# Patient Record
Sex: Female | Born: 1985 | Race: Black or African American | Hispanic: No | Marital: Single | Smoking: Never smoker
Health system: Southern US, Community
[De-identification: ages and names within clinical notes are randomized; demographics above are authoritative.]

## PROBLEM LIST (undated history)

## (undated) DIAGNOSIS — E11319 Type 2 diabetes mellitus with unspecified diabetic retinopathy without macular edema: Secondary | ICD-10-CM

## (undated) DIAGNOSIS — N289 Disorder of kidney and ureter, unspecified: Secondary | ICD-10-CM

## (undated) DIAGNOSIS — N183 Chronic kidney disease, stage 3 unspecified: Secondary | ICD-10-CM

## (undated) DIAGNOSIS — H547 Unspecified visual loss: Secondary | ICD-10-CM

## (undated) DIAGNOSIS — E119 Type 2 diabetes mellitus without complications: Secondary | ICD-10-CM

## (undated) DIAGNOSIS — I10 Essential (primary) hypertension: Secondary | ICD-10-CM

## (undated) HISTORY — PX: OTHER SURGICAL HISTORY: SHX169

## (undated) HISTORY — PX: EYE SURGERY: SHX253

## (undated) HISTORY — DX: Type 2 diabetes mellitus without complications: E11.9

## (undated) HISTORY — PX: APPENDECTOMY: SHX54

## (undated) HISTORY — PX: CHOLECYSTECTOMY: SHX55

## (undated) HISTORY — PX: ORIF FEMUR FRACTURE: SHX2119

---

## 2017-11-17 ENCOUNTER — Other Ambulatory Visit: Payer: Self-pay

## 2017-11-17 ENCOUNTER — Emergency Department (HOSPITAL_COMMUNITY)
Admission: EM | Admit: 2017-11-17 | Discharge: 2017-11-17 | Disposition: A | Discharge status: Home / Self Care | Payer: Self-pay | Attending: Emergency Medicine | Admitting: Emergency Medicine

## 2017-11-17 ENCOUNTER — Encounter (HOSPITAL_COMMUNITY): Payer: Self-pay | Admitting: Emergency Medicine

## 2017-11-17 DIAGNOSIS — R739 Hyperglycemia, unspecified: Secondary | ICD-10-CM

## 2017-11-17 DIAGNOSIS — E1065 Type 1 diabetes mellitus with hyperglycemia: Secondary | ICD-10-CM | POA: Insufficient documentation

## 2017-11-17 DIAGNOSIS — R112 Nausea with vomiting, unspecified: Secondary | ICD-10-CM | POA: Insufficient documentation

## 2017-11-17 HISTORY — DX: Type 2 diabetes mellitus without complications: E11.9

## 2017-11-17 LAB — CBC WITH DIFFERENTIAL/PLATELET
BASOS PCT: 0 %
Basophils Absolute: 0 10*3/uL (ref 0.0–0.1)
Eosinophils Absolute: 0 10*3/uL (ref 0.0–0.7)
Eosinophils Relative: 0 %
HEMATOCRIT: 41.3 % (ref 36.0–46.0)
HEMOGLOBIN: 15.2 g/dL — AB (ref 12.0–15.0)
Lymphocytes Relative: 25 %
Lymphs Abs: 1.7 10*3/uL (ref 0.7–4.0)
MCH: 30.5 pg (ref 26.0–34.0)
MCHC: 36.8 g/dL — AB (ref 30.0–36.0)
MCV: 82.8 fL (ref 78.0–100.0)
MONOS PCT: 2 %
Monocytes Absolute: 0.1 10*3/uL (ref 0.1–1.0)
NEUTROS ABS: 5 10*3/uL (ref 1.7–7.7)
NEUTROS PCT: 73 %
PLATELETS: 225 10*3/uL (ref 150–400)
RBC: 4.99 MIL/uL (ref 3.87–5.11)
RDW: 11.6 % (ref 11.5–15.5)
WBC: 6.9 10*3/uL (ref 4.0–10.5)

## 2017-11-17 LAB — URINALYSIS, ROUTINE W REFLEX MICROSCOPIC
BILIRUBIN URINE: NEGATIVE
Glucose, UA: >=500 mg/dL — AB
HGB URINE DIPSTICK: NEGATIVE
Ketones, ur: 20 mg/dL — AB
Leukocytes, UA: NEGATIVE
NITRITE: NEGATIVE
PH: 7 (ref 5.0–8.0)
Protein, ur: 100 mg/dL — AB
SPECIFIC GRAVITY, URINE: 1.012 (ref 1.005–1.030)

## 2017-11-17 LAB — COMPREHENSIVE METABOLIC PANEL
ALK PHOS: 65 U/L (ref 38–126)
ALT: 16 U/L (ref 0–44)
AST: 16 U/L (ref 15–41)
Albumin: 3.4 g/dL — ABNORMAL LOW (ref 3.5–5.0)
Anion gap: 9 (ref 5–15)
BUN: 16 mg/dL (ref 6–20)
CALCIUM: 9.3 mg/dL (ref 8.9–10.3)
CO2: 26 mmol/L (ref 22–32)
CREATININE: 0.69 mg/dL (ref 0.44–1.00)
Chloride: 101 mmol/L (ref 98–111)
Glucose, Bld: 303 mg/dL — ABNORMAL HIGH (ref 70–99)
Potassium: 3.8 mmol/L (ref 3.5–5.1)
Sodium: 136 mmol/L (ref 135–145)
Total Bilirubin: 0.6 mg/dL (ref 0.3–1.2)
Total Protein: 7.2 g/dL (ref 6.5–8.1)

## 2017-11-17 LAB — BLOOD GAS, VENOUS
ACID-BASE EXCESS: 0.3 mmol/L (ref 0.0–2.0)
Bicarbonate: 24.2 mmol/L (ref 20.0–28.0)
FIO2: 21
O2 SAT: 91.8 %
PATIENT TEMPERATURE: 98.6
pCO2, Ven: 38.7 mmHg — ABNORMAL LOW (ref 44.0–60.0)
pH, Ven: 7.412 (ref 7.250–7.430)
pO2, Ven: 62.8 mmHg — ABNORMAL HIGH (ref 32.0–45.0)

## 2017-11-17 LAB — CBG MONITORING, ED
GLUCOSE-CAPILLARY: 235 mg/dL — AB (ref 70–99)
GLUCOSE-CAPILLARY: 190 mg/dL — AB (ref 70–99)
Glucose-Capillary: 300 mg/dL — ABNORMAL HIGH (ref 70–99)
Glucose-Capillary: 299 mg/dL — ABNORMAL HIGH (ref 70–99)
Glucose-Capillary: 233 mg/dL — ABNORMAL HIGH (ref 70–99)

## 2017-11-17 LAB — I-STAT BETA HCG BLOOD, ED (MC, WL, AP ONLY): I-stat hCG, quantitative: <5 m[IU]/mL (ref <5)

## 2017-11-17 LAB — LIPASE, BLOOD: Lipase: 24 U/L (ref 11–51)

## 2017-11-17 MED ORDER — PROMETHAZINE HCL 25 MG PO TABS: 25.0000 mg | ORAL_TABLET | Freq: Four times a day (QID) | ORAL | 0 refills | Status: DC | PRN

## 2017-11-17 MED ORDER — ONDANSETRON HCL 4 MG/2ML IJ SOLN
4.0000 mg | Freq: Once | INTRAMUSCULAR | Status: AC
  Administered 2017-11-17: 4 mg via INTRAVENOUS
  Filled 2017-11-17: qty 2

## 2017-11-17 MED ORDER — SODIUM CHLORIDE 0.9 % IV BOLUS
2000.0000 mL | Freq: Once | INTRAVENOUS | Status: AC
  Administered 2017-11-17: 2000 mL via INTRAVENOUS

## 2017-11-17 MED ORDER — IBUPROFEN 200 MG PO TABS
600.0000 mg | ORAL_TABLET | Freq: Once | ORAL | Status: AC
  Administered 2017-11-17: 600 mg via ORAL
  Filled 2017-11-17: qty 3

## 2017-11-17 MED ORDER — DEXTROSE-NACL 5-0.9 % IV SOLN
INTRAVENOUS | Status: DC
  Administered 2017-11-17: 05:00:00 via INTRAVENOUS

## 2017-11-17 MED ORDER — SODIUM CHLORIDE 0.9 % IV SOLN
INTRAVENOUS | Status: DC
  Administered 2017-11-17: 2.4 [IU]/h via INTRAVENOUS
  Administered 2017-11-17: 3.5 [IU]/h via INTRAVENOUS
  Administered 2017-11-17: 5.3 [IU]/h via INTRAVENOUS
  Filled 2017-11-17: qty 1

## 2017-11-17 MED ORDER — PROMETHAZINE HCL 25 MG/ML IJ SOLN
12.5000 mg | Freq: Once | INTRAMUSCULAR | Status: AC
  Administered 2017-11-17: 12.5 mg via INTRAVENOUS
  Filled 2017-11-17: qty 1

## 2017-11-17 NOTE — ED Provider Notes (Signed)
[facility] DEPT Provider Note: [name], MD, FACEP  CSN: [id number] MRN: [id number] ARRIVAL: 11/17/17 at [facility]  Vomiting and Hyperglycemia   HISTORY OF PRESENT ILLNESS  11/17/17 1:56 AM Bianca Tapia is a 32 y.o. female is a type I diabetic who only uses insulin intermittently.  She restarted using insulin several days ago.  Specifically she has been taking 10 units of Lantus with her evening meals and 5 units of insulin aspart in the morning.  2 days ago her sugar went as high as 698.  Yesterday her sugar went as high as 547 but went down to 247 later in the day.    She is here with with nausea and vomiting since 4:30 PM yesterday.  She has had persistent vomiting and has not been able to keep anything down on her stomach.  Her sugar here is noted to be 300.  She is not short of breath and she is not having abdominal pain.     Past Medical History:  Diagnosis Date  . Diabetes mellitus without complication ([place])    Type 1    Past Surgical History:  Procedure Laterality Date  . APPENDECTOMY    . c-section    . CHOLECYSTECTOMY      History reviewed. No pertinent family history.  Social History   Tobacco Use  . Smoking status: Never Smoker  . Smokeless tobacco: Never Used  Substance Use Topics  . Alcohol use: Never    Frequency: Never  . Drug use: Never    Prior to Admission medications   Medication Sig Start Date End Date Taking? Authorizing Provider  insulin glargine (LANTUS) 100 UNIT/ML injection Inject 10 Units into the skin daily after supper.   Yes [provider]  insulin regular (NOVOLIN R,HUMULIN R) 100 units/mL injection Inject 5 Units into the skin 3 (three) times daily before meals. Sliding scale Pt uses kwikpen   Yes [provider]    Allergies Other and Penicillins   REVIEW OF SYSTEMS  Negative except as noted here or in the History of Present Illness.   PHYSICAL EXAMINATION  Initial  Vital Signs Blood pressure (!) 159/109, pulse 94, temperature 98.3 F (36.8 C), temperature source Oral, resp. rate 17, height 5\' 1"  (1.549 m), weight 81.6 kg (180 lb), last menstrual period 10/22/2017, SpO2 100 %.  Examination General: Well-developed, well-nourished female in no acute distress; appearance consistent with age of record HENT: normocephalic; atraumatic; breath nonketotic Eyes: pupils equal, round and reactive to light; extraocular muscles intact Neck: supple Heart: regular rate and rhythm Lungs: clear to auscultation bilaterally Abdomen: soft; nondistended; nontender; no masses or hepatosplenomegaly; bowel sounds present Extremities: No deformity; full range of motion; pulses normal Neurologic: Awake, alert and oriented; motor function intact in all extremities and symmetric; no facial droop Skin: Warm and dry Psychiatric: Normal mood and affect   RESULTS  Summary of this visit's results, reviewed by myself:   EKG Interpretation  Date/Time:    Ventricular Rate:    PR Interval:    QRS Duration:   QT Interval:    QTC Calculation:   R Axis:     Text Interpretation:        Laboratory Studies: Results for orders placed or performed during the hospital encounter of 11/17/17 (from the past 24 hour(s))  CBG monitoring, ED     Status: Abnormal   Collection Time: 11/17/17  2:07 AM  Result Value Ref Range   Glucose-Capillary 300 (  H) 70 - 99 mg/dL  Blood gas, venous     Status: Abnormal   Collection Time: 11/17/17  3:01 AM  Result Value Ref Range   FIO2 21.00    pH, Ven 7.412 7.250 - 7.430   pCO2, Ven 38.7 (L) 44.0 - 60.0 mmHg   pO2, Ven 62.8 (H) 32.0 - 45.0 mmHg   Bicarbonate 24.2 20.0 - 28.0 mmol/L   Acid-Base Excess 0.3 0.0 - 2.0 mmol/L   O2 Saturation 91.8 %   Patient temperature 98.6    Collection site VEIN    Drawn by COLLECTED BY NURSE    Sample type VENOUS   CBG monitoring, ED     Status: Abnormal   Collection Time: 11/17/17  3:02 AM  Result Value  Ref Range   Glucose-Capillary 299 (H) 70 - 99 mg/dL  Lipase, blood     Status: None   Collection Time: 11/17/17  3:04 AM  Result Value Ref Range   Lipase 24 11 - 51 U/L  Comprehensive metabolic panel     Status: Abnormal   Collection Time: 11/17/17  3:04 AM  Result Value Ref Range   Sodium 136 135 - 145 mmol/L   Potassium 3.8 3.5 - 5.1 mmol/L   Chloride 101 98 - 111 mmol/L   CO2 26 22 - 32 mmol/L   Glucose, Bld 303 (H) 70 - 99 mg/dL   BUN 16 6 - 20 mg/dL   Creatinine, Ser 0.69 0.44 - 1.00 mg/dL   Calcium 9.3 8.9 - 10.3 mg/dL   Total Protein 7.2 6.5 - 8.1 g/dL   Albumin 3.4 (L) 3.5 - 5.0 g/dL   AST 16 15 - 41 U/L   ALT 16 0 - 44 U/L   Alkaline Phosphatase 65 38 - 126 U/L   Total Bilirubin 0.6 0.3 - 1.2 mg/dL   GFR calc non Af Amer >60 >60 mL/min   GFR calc Af Amer >60 >60 mL/min   Anion gap 9 5 - 15  CBC with Differential/Platelet     Status: Abnormal   Collection Time: 11/17/17  3:04 AM  Result Value Ref Range   WBC 6.9 4.0 - 10.5 K/uL   RBC 4.99 3.87 - 5.11 MIL/uL   Hemoglobin 15.2 (H) 12.0 - 15.0 g/dL   HCT 41.3 36.0 - 46.0 %   MCV 82.8 78.0 - 100.0 fL   MCH 30.5 26.0 - 34.0 pg   MCHC 36.8 (H) 30.0 - 36.0 g/dL   RDW 11.6 11.5 - 15.5 %   Platelets 225 150 - 400 K/uL   Neutrophils Relative % 73 %   Neutro Abs 5.0 1.7 - 7.7 K/uL   Lymphocytes Relative 25 %   Lymphs Abs 1.7 0.7 - 4.0 K/uL   Monocytes Relative 2 %   Monocytes Absolute 0.1 0.1 - 1.0 K/uL   Eosinophils Relative 0 %   Eosinophils Absolute 0.0 0.0 - 0.7 K/uL   Basophils Relative 0 %   Basophils Absolute 0.0 0.0 - 0.1 K/uL  I-Stat beta hCG blood, ED     Status: None   Collection Time: 11/17/17  3:27 AM  Result Value Ref Range   I-stat hCG, quantitative <5.0 <5 mIU/mL   Comment 3          CBG monitoring, ED     Status: Abnormal   Collection Time: 11/17/17  4:13 AM  Result Value Ref Range   Glucose-Capillary 233 (H) 70 - 99 mg/dL   Comment 1 Notify RN  Comment 2 Document in Chart   Urinalysis,  Routine w reflex microscopic     Status: Abnormal   Collection Time: 11/17/17  4:40 AM  Result Value Ref Range   Color, Urine YELLOW YELLOW   APPearance CLEAR CLEAR   Specific Gravity, Urine 1.012 1.005 - 1.030   pH 7.0 5.0 - 8.0   Glucose, UA >=500 (A) NEGATIVE mg/dL   Hgb urine dipstick NEGATIVE NEGATIVE   Bilirubin Urine NEGATIVE NEGATIVE   Ketones, ur 20 (A) NEGATIVE mg/dL   Protein, ur 100 (A) NEGATIVE mg/dL   Nitrite NEGATIVE NEGATIVE   Leukocytes, UA NEGATIVE NEGATIVE   RBC / HPF 0-5 0 - 5 RBC/hpf   WBC, UA 0-5 0 - 5 WBC/hpf   Bacteria, UA MANY (A) NONE SEEN   Squamous Epithelial / LPF 0-5 0 - 5  CBG monitoring, ED     Status: Abnormal   Collection Time: 11/17/17  5:28 AM  Result Value Ref Range   Glucose-Capillary 235 (H) 70 - 99 mg/dL   Comment 1 Notify RN    Comment 2 Document in Chart   POC CBG, ED     Status: Abnormal   Collection Time: 11/17/17  6:46 AM  Result Value Ref Range   Glucose-Capillary 190 (H) 70 - 99 mg/dL   Imaging Studies: No results found.  ED COURSE and MDM  Nursing notes and initial vitals signs, including pulse oximetry, reviewed.  Vitals:   11/17/17 0430 11/17/17 0430 11/17/17 0515 11/17/17 0646  BP: (!) 161/122 (!) 161/122 (!) 144/91 (!) 130/92  Pulse: (!) 107 (!) 109 (!) 109 (!) 101  Resp:  19 18 17   Temp:      TempSrc:      SpO2: 99% 99% 100% 100%  Weight:      Height:       6:58 AM Patient sugar down to 190.  Should her nausea return but is now controlled with Phenergan.  She has been able to tolerate oral intake without vomiting.  She is requesting a prescription for Phenergan tablets at home.  She was advised to return if symptoms worsen as she is at risk for DKA although was not in DKA on this visit.  PROCEDURES    ED DIAGNOSES     ICD-10-CM   1. Nausea and vomiting in adult R11.2   2. Hyperglycemia R73.9        Lovell Nuttall, Jenny Reichmann, MD 11/17/17 [phone]

## 2017-11-17 NOTE — ED Triage Notes (Signed)
Pt reports having vomiting since 4pm and has Type 1 DM and last reading was over 500.

## 2017-11-22 ENCOUNTER — Other Ambulatory Visit: Payer: Self-pay

## 2017-11-22 ENCOUNTER — Encounter (HOSPITAL_COMMUNITY): Payer: Self-pay | Admitting: *Deleted

## 2017-11-22 ENCOUNTER — Emergency Department (HOSPITAL_COMMUNITY): Payer: Self-pay

## 2017-11-22 ENCOUNTER — Emergency Department (HOSPITAL_COMMUNITY)
Admission: EM | Admit: 2017-11-22 | Discharge: 2017-11-22 | Disposition: A | Discharge status: Home / Self Care | Payer: Self-pay | Attending: Emergency Medicine | Admitting: Emergency Medicine

## 2017-11-22 DIAGNOSIS — G43909 Migraine, unspecified, not intractable, without status migrainosus: Secondary | ICD-10-CM | POA: Insufficient documentation

## 2017-11-22 DIAGNOSIS — N390 Urinary tract infection, site not specified: Secondary | ICD-10-CM | POA: Insufficient documentation

## 2017-11-22 DIAGNOSIS — Z79899 Other long term (current) drug therapy: Secondary | ICD-10-CM | POA: Insufficient documentation

## 2017-11-22 DIAGNOSIS — I10 Essential (primary) hypertension: Secondary | ICD-10-CM | POA: Insufficient documentation

## 2017-11-22 DIAGNOSIS — E119 Type 2 diabetes mellitus without complications: Secondary | ICD-10-CM | POA: Insufficient documentation

## 2017-11-22 DIAGNOSIS — G43809 Other migraine, not intractable, without status migrainosus: Secondary | ICD-10-CM

## 2017-11-22 LAB — CBG MONITORING, ED
Glucose-Capillary: 182 mg/dL — ABNORMAL HIGH (ref 70–99)
Glucose-Capillary: 237 mg/dL — ABNORMAL HIGH (ref 70–99)

## 2017-11-22 LAB — CBC
HEMATOCRIT: 39.9 % (ref 36.0–46.0)
Hemoglobin: 14 g/dL (ref 12.0–15.0)
MCH: 30.1 pg (ref 26.0–34.0)
MCHC: 35.1 g/dL (ref 30.0–36.0)
MCV: 85.8 fL (ref 78.0–100.0)
Platelets: 232 10*3/uL (ref 150–400)
RBC: 4.65 MIL/uL (ref 3.87–5.11)
RDW: 11.8 % (ref 11.5–15.5)
WBC: 4.5 10*3/uL (ref 4.0–10.5)

## 2017-11-22 LAB — URINALYSIS, ROUTINE W REFLEX MICROSCOPIC
Bilirubin Urine: NEGATIVE
GLUCOSE, UA: 100 mg/dL — AB
Ketones, ur: NEGATIVE mg/dL
LEUKOCYTES UA: NEGATIVE
Nitrite: NEGATIVE
PH: 7.5 (ref 5.0–8.0)
Protein, ur: 100 mg/dL — AB
Specific Gravity, Urine: 1.025 (ref 1.005–1.030)

## 2017-11-22 LAB — URINALYSIS, MICROSCOPIC (REFLEX)

## 2017-11-22 LAB — BASIC METABOLIC PANEL
Anion gap: 7 (ref 5–15)
BUN: 12 mg/dL (ref 6–20)
CHLORIDE: 104 mmol/L (ref 98–111)
CO2: 29 mmol/L (ref 22–32)
Calcium: 9.2 mg/dL (ref 8.9–10.3)
Creatinine, Ser: 0.78 mg/dL (ref 0.44–1.00)
GFR calc non Af Amer: >60 mL/min (ref >60)
Glucose, Bld: 206 mg/dL — ABNORMAL HIGH (ref 70–99)
POTASSIUM: 4 mmol/L (ref 3.5–5.1)
SODIUM: 140 mmol/L (ref 135–145)

## 2017-11-22 LAB — I-STAT BETA HCG BLOOD, ED (MC, WL, AP ONLY)

## 2017-11-22 MED ORDER — KETOROLAC TROMETHAMINE 30 MG/ML IJ SOLN
30.0000 mg | Freq: Once | INTRAMUSCULAR | Status: AC
  Administered 2017-11-22: 30 mg via INTRAVENOUS
  Filled 2017-11-22: qty 1

## 2017-11-22 MED ORDER — HYDROCHLOROTHIAZIDE 25 MG PO TABS: 25.0000 mg | ORAL_TABLET | Freq: Every day | ORAL | 0 refills | Status: DC

## 2017-11-22 MED ORDER — NITROFURANTOIN MONOHYD MACRO 100 MG PO CAPS: 100.0000 mg | ORAL_CAPSULE | Freq: Two times a day (BID) | ORAL | 0 refills | Status: DC

## 2017-11-22 MED ORDER — PROCHLORPERAZINE MALEATE 10 MG PO TABS
10.0000 mg | ORAL_TABLET | Freq: Four times a day (QID) | ORAL | Status: DC | PRN
  Filled 2017-11-22: qty 1

## 2017-11-22 MED ORDER — HYDROCHLOROTHIAZIDE 12.5 MG PO CAPS
25.0000 mg | ORAL_CAPSULE | Freq: Once | ORAL | Status: AC
  Administered 2017-11-22: 25 mg via ORAL
  Filled 2017-11-22: qty 2

## 2017-11-22 MED ORDER — PROCHLORPERAZINE MALEATE 10 MG PO TABS
10.0000 mg | ORAL_TABLET | Freq: Once | ORAL | Status: AC
  Administered 2017-11-22: 10 mg via ORAL

## 2017-11-22 MED ORDER — DIPHENHYDRAMINE HCL 50 MG/ML IJ SOLN
25.0000 mg | Freq: Once | INTRAMUSCULAR | Status: AC
  Administered 2017-11-22: 25 mg via INTRAVENOUS
  Filled 2017-11-22: qty 1

## 2017-11-22 MED ORDER — ONDANSETRON 4 MG PO TBDP
4.0000 mg | ORAL_TABLET | Freq: Once | ORAL | Status: AC | PRN
  Administered 2017-11-22: 4 mg via ORAL
  Filled 2017-11-22: qty 1

## 2017-11-22 NOTE — Discharge Instructions (Addendum)
Return to ED for worsening symptoms, severe headache or chest pain, shortness of breath, coughing up blood. It is important that you follow-up with a primary care provider regarding your blood pressure and routine lab work.  Please present to [facility] urgent care/family medicine in 1 week for recheck of your electrolytes.  You should be hearing from [facility] as I placed a referral to them, however you do not hear from them within 7 to 10 days, please place a call to them.  If possible, please keep a headache log with checks of your blood pressure at [facility] or other outpatient facilities until you can establish primary care.

## 2017-11-22 NOTE — ED Notes (Signed)
Patient does not have glasses with her in the ED for visual acuity screening. PA made aware.

## 2017-11-22 NOTE — ED Provider Notes (Signed)
[facility] DEPT Provider Note   CSN: [id number] Arrival date & time: 11/22/17  1243     History   Chief Complaint Chief Complaint  Patient presents with  . Emesis  . Headache    HPI Bianca Tapia is a 32 y.o. female with a past medical history of type 1 diabetes, who presents to ED for evaluation of vomiting that began this morning.  She reports 2 episodes of nonbloody, nonbilious emesis.  She also complains of headache for the past 7 days, with associated nausea, phonophobia and photophobia it is unrelieved with Tylenol and ibuprofen.  Reports history of headaches but states that usually they are relieved with over-the-counter medications.  She is concerned because her blood pressure is high.  She also states she has had lower extremity edema the past week.  She was seen and evaluated in the ED 1 week ago but states that "they never treated my blood pressure." She is concerned because her "urine smells like acetone." Patient recently moved to [place] state and has not yet established with a primary care provider here.  She reports compliance with her home insulin regimen.  Denies any hyperglycemia, diarrhea, fever, cough, neck pain, head injuries or falls, numbness in arms or legs, chest pain or shortness of breath.  HPI  Past Medical History:  Diagnosis Date  . Diabetes mellitus without complication ([place])    Type 1    There are no active problems to display for this patient.   Past Surgical History:  Procedure Laterality Date  . APPENDECTOMY    . c-section    . CHOLECYSTECTOMY       OB History   None      Home Medications    Prior to Admission medications   Medication Sig Start Date End Date Taking? Authorizing Provider  hydrochlorothiazide (HYDRODIURIL) 25 MG tablet Take 1 tablet (25 mg total) by mouth daily. 11/22/17   Charlsey Moragne, PA-C  insulin glargine (LANTUS) 100 UNIT/ML injection Inject 10 Units into the skin daily  after supper.    [provider]  insulin regular (NOVOLIN R,HUMULIN R) 100 units/mL injection Inject 5 Units into the skin 3 (three) times daily before meals. Sliding scale Pt uses Forensic psychologist, Historical, MD  nitrofurantoin, macrocrystal-monohydrate, (MACROBID) 100 MG capsule Take 1 capsule (100 mg total) by mouth 2 (two) times daily. 11/22/17   Deloras Reichard, PA-C  promethazine (PHENERGAN) 25 MG tablet Take 1 tablet (25 mg total) by mouth every 6 (six) hours as needed for nausea or vomiting. 11/17/17   Molpus, Jenny Reichmann, MD    Family History No family history on file.  Social History Social History   Tobacco Use  . Smoking status: Never Smoker  . Smokeless tobacco: Never Used  Substance Use Topics  . Alcohol use: Never    Frequency: Never  . Drug use: Never     Allergies   Other and Penicillins   Review of Systems Review of Systems  Constitutional: Positive for appetite change. Negative for chills and fever.  HENT: Negative for ear pain, rhinorrhea, sneezing and sore throat.   Eyes: Positive for photophobia. Negative for visual disturbance.  Respiratory: Negative for cough, chest tightness, shortness of breath and wheezing.   Cardiovascular: Positive for leg swelling. Negative for chest pain and palpitations.  Gastrointestinal: Positive for constipation, nausea and vomiting. Negative for abdominal pain, blood in stool and diarrhea.  Genitourinary: Negative for dysuria, hematuria and urgency.  Musculoskeletal:  Negative for myalgias.  Skin: Negative for rash.  Neurological: Positive for headaches. Negative for dizziness, weakness and light-headedness.     Physical Exam Updated Vital Signs BP (!) 141/116 (BP Location: Right Arm)   Pulse 92   Temp 98 F (36.7 C) (Oral)   Resp 18   Ht 5\' 1"  (1.549 m)   Wt 81.6 kg (180 lb)   LMP 11/19/2017   SpO2 99%   BMI 34.01 kg/m   Physical Exam  Constitutional: She is oriented to person, place, and time. She appears  well-developed and well-nourished. No distress.  HENT:  Head: Normocephalic and atraumatic.  Nose: Nose normal.  Eyes: Conjunctivae and EOM are normal. Left eye exhibits no discharge. No scleral icterus.  Neck: Normal range of motion. Neck supple.  No meningismus.  Cardiovascular: Normal rate, regular rhythm, normal heart sounds and intact distal pulses. Exam reveals no gallop and no friction rub.  No murmur heard. Pulmonary/Chest: Effort normal and breath sounds normal. No respiratory distress.  Abdominal: Soft. Bowel sounds are normal. She exhibits no distension. There is no tenderness. There is no guarding.  Musculoskeletal: Normal range of motion. She exhibits edema.  1+ pitting edema in bilateral lower extremities symmetrically.  No calf tenderness bilaterally.  Neurological: She is alert and oriented to person, place, and time. No cranial nerve deficit or sensory deficit. She exhibits normal muscle tone. Coordination normal.  Pupils reactive. No facial asymmetry noted. Cranial nerves appear grossly intact. Sensation intact to light touch on face, BUE and BLE. Strength 5/5 in BUE and BLE.   Skin: Skin is warm and dry. No rash noted.  Psychiatric: She has a normal mood and affect.  Nursing note and vitals reviewed.    ED Treatments / Results  Labs (all labs ordered are listed, but only abnormal results are displayed) Labs Reviewed  BASIC METABOLIC PANEL - Abnormal; Notable for the following components:      Result Value   Glucose, Bld 206 (*)    All other components within normal limits  URINALYSIS, ROUTINE W REFLEX MICROSCOPIC - Abnormal; Notable for the following components:   APPearance CLOUDY (*)    Glucose, UA 100 (*)    Hgb urine dipstick SMALL (*)    Protein, ur 100 (*)    All other components within normal limits  URINALYSIS, MICROSCOPIC (REFLEX) - Abnormal; Notable for the following components:   Bacteria, UA MANY (*)    All other components within normal limits    CBG MONITORING, ED - Abnormal; Notable for the following components:   Glucose-Capillary 182 (*)    All other components within normal limits  CBC  I-STAT BETA HCG BLOOD, ED ([facility], [facility], [facility] ONLY)    EKG None  Radiology Dg Chest 2 View  Result Date: 11/22/2017 CLINICAL DATA:  Vomiting for several hours EXAM: CHEST - 2 VIEW COMPARISON:  None. FINDINGS: The heart size and mediastinal contours are within normal limits. Both lungs are clear. The visualized skeletal structures are unremarkable. IMPRESSION: No active cardiopulmonary disease. Electronically Signed   By: Inez Catalina M.D.   On: 11/22/2017 15:24   Ct Head Wo Contrast  Result Date: 11/22/2017 CLINICAL DATA:  Headaches EXAM: CT HEAD WITHOUT CONTRAST TECHNIQUE: Contiguous axial images were obtained from the base of the skull through the vertex without intravenous contrast. COMPARISON:  None. FINDINGS: Brain: No evidence of acute infarction, hemorrhage, hydrocephalus, extra-axial collection or mass lesion/mass effect. Vascular: No hyperdense vessel or unexpected calcification. Skull: Normal. Negative for fracture  or focal lesion. Sinuses/Orbits: No acute finding. Other: None. IMPRESSION: Normal head CT Electronically Signed   By: Inez Catalina M.D.   On: 11/22/2017 15:34    Procedures Procedures (including critical care time)  Medications Ordered in ED Medications  ketorolac (TORADOL) 30 MG/ML injection 30 mg (has no administration in time range)  hydrochlorothiazide (MICROZIDE) capsule 25 mg (has no administration in time range)  ondansetron (ZOFRAN-ODT) disintegrating tablet 4 mg (4 mg Oral Given 11/22/17 1309)  diphenhydrAMINE (BENADRYL) injection 25 mg (25 mg Intravenous Given 11/22/17 1528)  prochlorperazine (COMPAZINE) tablet 10 mg (10 mg Oral Given 11/22/17 1528)     Initial Impression / Assessment and Plan / ED Course  I have reviewed the triage vital signs and the nursing notes.  Pertinent labs & imaging results that were  available during my care of the patient were reviewed by me and considered in my medical decision making (see chart for details).     32 year old female with a past medical history of type 1 diabetes presents to ED for evaluation of vomiting that began this morning.  She reports 2 episodes of nonbloody, nonbilious emesis and headache for the past 7 days with associated nausea, phonophobia and photophobia.  Headache is not relieved with Tylenol and ibuprofen which she took the first 3 days of symptoms onset.  She also is concerned because her blood pressure is high and her urine "smells like acetone."  She states that she also has bilateral lower extremity edema.  She moved to [place] of [place] and has not yet established with a primary care provider here.  Reports compliance with her regimen.  On physical exam she is overall well-appearing.  No abdominal tenderness to palpation.  No deficits on neurological exam noted.  There is 1+ pitting edema in bilateral lower extremities with no erythema or calf tenderness.  Lab work including CBC, BMP, CBG, hCG unremarkable.  Urinalysis shows many bacteria but leukocyte and nitrite negative.  Chest x-ray is unremarkable. CT head wnl. Patient given migraine cocktail with improvement in her symptoms.  Will give 1 dose of HCTZ 25 mg for hypertension.  Will reassess after rest. Care handed off to A. Valere Dross, PA-C ending reassessment.  Macrobid for UTI.  Portions of this note were generated with Lobbyist. Dictation errors may occur despite best attempts at proofreading.   Final Clinical Impressions(s) / ED Diagnoses   Final diagnoses:  Other migraine without status migrainosus, not intractable  Hypertension, unspecified type  Lower urinary tract infectious disease    ED Discharge Orders        Ordered    hydrochlorothiazide (HYDRODIURIL) 25 MG tablet  Daily     11/22/17 1646    nitrofurantoin, macrocrystal-monohydrate, (MACROBID) 100  MG capsule  2 times daily     11/22/17 1646       Delia Heady, PA-C 11/22/17 1647    Malvin Johns, MD 11/22/17 1820

## 2017-11-22 NOTE — ED Notes (Signed)
Reviewed discharge instructions and prescriptions with pt and family prior to discharge. Pt verbalized understanding of such.

## 2017-11-22 NOTE — ED Provider Notes (Signed)
Physical Exam  BP (!) 141/116 (BP Location: Right Arm)   Pulse 92   Temp 98 F (36.7 C) (Oral)   Resp 18   Ht 5\' 1"  (1.549 m)   Wt 81.6 kg (180 lb)   LMP 11/19/2017   SpO2 99%   BMI 34.01 kg/m   Assumed care from [facility], PA-C at 4:38 PM. Briefly, the patient is a 32 y.o. female with PMHx of  has a past medical history of Diabetes mellitus without complication ([place]). here with vomiting, HA, and HTN.  Patient reports that after she got her headache that began 7 days ago was due to either elevated sugar or elevated blood pressure.  Patient reports that control of her blood sugar was not improving headache.  Patient trying multiple outpatient over-the-counter medication to prevent headache without relief.  Patient had resolution of headache with fluids, antiemetics, however reports that it has returned to my evaluation. Also reporting blurred vision bilaterally, appears to be intermittent in nature and associated with getting new glasses recently.  Labs Reviewed  BASIC METABOLIC PANEL - Abnormal; Notable for the following components:      Result Value   Glucose, Bld 206 (*)    All other components within normal limits  URINALYSIS, ROUTINE W REFLEX MICROSCOPIC - Abnormal; Notable for the following components:   APPearance CLOUDY (*)    Glucose, UA 100 (*)    Hgb urine dipstick SMALL (*)    Protein, ur 100 (*)    All other components within normal limits  URINALYSIS, MICROSCOPIC (REFLEX) - Abnormal; Notable for the following components:   Bacteria, UA MANY (*)    All other components within normal limits  CBG MONITORING, ED - Abnormal; Notable for the following components:   Glucose-Capillary 182 (*)    All other components within normal limits  CBC  I-STAT BETA HCG BLOOD, ED ([facility], [facility], [facility] ONLY)    Course of Care:   Physical Exam  Constitutional: She appears well-developed and well-nourished. No distress.  Sitting comfortably in bed.  HENT:  Head: Normocephalic and  atraumatic.  Eyes: Conjunctivae are normal. Right eye exhibits no discharge. Left eye exhibits no discharge.  EOMs normal to gross examination. Unable to fully assess fundi due to suboptimal pupil dilation. No retinal hemorrhage or exudate.  Neck: Normal range of motion.  Cardiovascular: Normal rate and regular rhythm.  Intact, 2+ radial pulse.  Pulmonary/Chest:  Normal respiratory effort. Patient converses comfortably. No audible wheeze or stridor.  Abdominal: She exhibits no distension.  Musculoskeletal: Normal range of motion.  Neurological: She is alert. GCS eye subscore is 4. GCS verbal subscore is 5. GCS motor subscore is 6.  Cranial nerves intact to gross observation. Pupils PERRL. EOMI. Patient moves extremities without difficulty.  Skin: Skin is warm and dry. She is not diaphoretic.  Psychiatric: She has a normal mood and affect. Her behavior is normal. Judgment and thought content normal.  Nursing note and vitals reviewed.   ED Course/Procedures   Clinical Course as of Nov 22 2052  Sat Nov 22, 2017  1816 Patient reassessed.  Patient reporting return of headache. Will administer Toradol per previous provider plan.   [AM]  2042 Reassessed.  Patient feeling better.  Discussed the plan including follow-up at [facility] urgent care in 1 week as well as amatory referral to neurology.  Patient is in understanding and agrees with the plan of care.   [AM]    Clinical Course User Index [AM] Albesa Seen, PA-C  Procedures  MDM  Patient nontoxic-appearing and in no acute distress.  Per plan discussed the previous provider, administered Toradol for headache.  Patient had improvement of headache.  No red flag symptoms per previous provider nor on my examination.  Did have this intermittent blurred vision, while she did have a recent change in her glasses that she appears to associate it with, will refer to neurology.  Fundi were not visible on my exam.  Patient has had persistently  elevated blood pressures on 2 ED visits now.  Patient is concerned about this.  Per discussion from previous provider, reasonable to start blood pressure medication.  I counseled patient on the side effects of HCTZ including increased diuresis.  Patient to follow-up in 1 week at [facility] urgent care for recheck of electrolytes.  Patient given outpatient primary care resources.  Patient given return precautions for any new or worsening symptoms.  Patient is in understanding and agrees with the plan of care.     Tamala Julian 11/22/17 2057    Duffy Bruce, MD 11/22/17 2351

## 2017-11-22 NOTE — ED Triage Notes (Signed)
Pt presents with vomiting that began this morning. Hx DM.  Pt reports her urine smells like "nail polish remover."  Pt feet swelling and a headache.  Pt reports not being able to keep her blood sugars in check.  Pt reports that this morning her CBG was 202.  Pt a/o x 4.

## 2018-03-25 ENCOUNTER — Emergency Department (HOSPITAL_COMMUNITY): Payer: Self-pay

## 2018-03-25 ENCOUNTER — Encounter (HOSPITAL_COMMUNITY): Payer: Self-pay | Admitting: Emergency Medicine

## 2018-03-25 ENCOUNTER — Emergency Department (HOSPITAL_COMMUNITY)
Admission: EM | Admit: 2018-03-25 | Discharge: 2018-03-25 | Disposition: A | Discharge status: Home / Self Care | Payer: Self-pay | Attending: Emergency Medicine | Admitting: Emergency Medicine

## 2018-03-25 DIAGNOSIS — Z794 Long term (current) use of insulin: Secondary | ICD-10-CM | POA: Insufficient documentation

## 2018-03-25 DIAGNOSIS — H6691 Otitis media, unspecified, right ear: Secondary | ICD-10-CM | POA: Insufficient documentation

## 2018-03-25 DIAGNOSIS — Z79899 Other long term (current) drug therapy: Secondary | ICD-10-CM | POA: Insufficient documentation

## 2018-03-25 DIAGNOSIS — J069 Acute upper respiratory infection, unspecified: Secondary | ICD-10-CM | POA: Insufficient documentation

## 2018-03-25 DIAGNOSIS — E109 Type 1 diabetes mellitus without complications: Secondary | ICD-10-CM | POA: Insufficient documentation

## 2018-03-25 MED ORDER — AZITHROMYCIN 250 MG PO TABS: ORAL_TABLET | ORAL | 0 refills | Status: DC

## 2018-03-25 MED ORDER — BENZONATATE 100 MG PO CAPS: 100.0000 mg | ORAL_CAPSULE | Freq: Three times a day (TID) | ORAL | 0 refills | Status: DC

## 2018-03-25 NOTE — ED Provider Notes (Signed)
[facility] DEPT Provider Note   CSN: [id number] Arrival date & time: 03/25/18  1511     History   Chief Complaint Chief Complaint  Patient presents with  . Cough  . Nasal Congestion  . Facial Pain    HPI Bianca Tapia is a 32 y.o. female who presents to the ED with productive cough, congestion, ear pain and sinus pain. Symptoms started a week ago. Patient reports coughing up yellow sputum.   The history is provided by the patient. No language interpreter was used.  Cough  This is a new problem. The current episode started more than 2 days ago. The cough is productive of purulent sputum. There has been no fever. Associated symptoms include chills, ear pain, headaches (slight), rhinorrhea, myalgias and shortness of breath. Pertinent negatives include no sore throat, no wheezing and no eye redness. She has tried decongestants for the symptoms. The treatment provided no relief. She is not a smoker. Her past medical history is significant for pneumonia.    Past Medical History:  Diagnosis Date  . Diabetes mellitus without complication ([place])    Type 1    There are no active problems to display for this patient.   Past Surgical History:  Procedure Laterality Date  . APPENDECTOMY    . c-section    . CHOLECYSTECTOMY       OB History   None      Home Medications    Prior to Admission medications   Medication Sig Start Date End Date Taking? Authorizing Provider  azithromycin (ZITHROMAX Z-PAK) 250 MG tablet Take 2 tablets now and then one tablet PO daily 03/25/18   Ashley Murrain, NP  benzonatate (TESSALON) 100 MG capsule Take 1 capsule (100 mg total) by mouth every 8 (eight) hours. 03/25/18   Ashley Murrain, NP  hydrochlorothiazide (HYDRODIURIL) 25 MG tablet Take 1 tablet (25 mg total) by mouth daily. 11/22/17   Khatri, Hina, PA-C  insulin glargine (LANTUS) 100 UNIT/ML injection Inject 10 Units into the skin daily after supper.    [provider]  insulin regular (NOVOLIN R,HUMULIN R) 100 units/mL injection Inject 5 Units into the skin 3 (three) times daily before meals. Sliding scale Pt uses Forensic psychologist, Historical, MD  nitrofurantoin, macrocrystal-monohydrate, (MACROBID) 100 MG capsule Take 1 capsule (100 mg total) by mouth 2 (two) times daily. 11/22/17   Khatri, Hina, PA-C  promethazine (PHENERGAN) 25 MG tablet Take 1 tablet (25 mg total) by mouth every 6 (six) hours as needed for nausea or vomiting. 11/17/17   Molpus, Jenny Reichmann, MD    Family History No family history on file.  Social History Social History   Tobacco Use  . Smoking status: Never Smoker  . Smokeless tobacco: Never Used  Substance Use Topics  . Alcohol use: Never    Frequency: Never  . Drug use: Never     Allergies   Other and Penicillins   Review of Systems Review of Systems  Constitutional: Positive for chills.  HENT: Positive for congestion, ear pain, rhinorrhea, sinus pressure and sinus pain. Negative for sore throat.   Eyes: Positive for itching. Negative for discharge and redness.  Respiratory: Positive for cough and shortness of breath. Negative for wheezing.   Gastrointestinal: Negative for abdominal pain, diarrhea, nausea and vomiting.  Genitourinary: Negative for dysuria, frequency and urgency.  Musculoskeletal: Positive for myalgias. Negative for back pain.  Skin: Negative for rash.  Neurological: Positive for headaches (slight). Negative for  syncope.  Psychiatric/Behavioral: Negative for confusion.     Physical Exam Updated Vital Signs BP (!) 137/94 (BP Location: Left Arm)   Pulse 99   Temp 98.6 F (37 C) (Oral)   Resp 18   Ht 5\' 2"  (1.575 m)   Wt 83.9 kg   SpO2 98%   BMI 33.84 kg/m   Physical Exam  Constitutional: She appears well-developed and well-nourished. No distress.  HENT:  Head: Normocephalic and atraumatic.  Right Ear: Tympanic membrane is bulging.  Left Ear: Tympanic membrane normal.  Nose:  Right sinus exhibits maxillary sinus tenderness.  Eyes: EOM are normal.  Neck: Neck supple.  Cardiovascular: Normal rate and regular rhythm.  Pulmonary/Chest: Effort normal. She has no wheezes. She has no rales. She exhibits no tenderness.  Abdominal: Soft. There is no tenderness.  Musculoskeletal: Normal range of motion.  Neurological: She is alert.  Skin: Skin is warm and dry.  Psychiatric: She has a normal mood and affect. Her behavior is normal.  Nursing note and vitals reviewed.    ED Treatments / Results  Labs (all labs ordered are listed, but only abnormal results are displayed) Labs Reviewed - No data to display Radiology Dg Chest 2 View  Result Date: 03/25/2018 CLINICAL DATA:  Sinus congestion, sinus pain, productive cough since last week. Diabetic, hypertension, chest pain. EXAM: CHEST - 2 VIEW COMPARISON:  Chest x-ray dated 11/22/2017. FINDINGS: Cardiomediastinal silhouette is within normal limits in size and configuration. Lungs are clear. Lung volumes are normal. No evidence of pneumonia. No pleural effusion. No pneumothorax seen. Osseous structures about the chest are unremarkable. IMPRESSION: No active cardiopulmonary disease. No evidence of pneumonia or pulmonary edema. Electronically Signed   By: Franki Cabot M.D.   On: 03/25/2018 17:42    Procedures Procedures (including critical care time)  Medications Ordered in ED Medications - No data to display   Initial Impression / Assessment and Plan / ED Course  I have reviewed the triage vital signs and the nursing notes. Pt CXR negative for acute infiltrate. Patients symptoms are consistent with URI and otitis media.  Discussed that antibiotics are for the ear infection. Pt will be discharged with Z-pak since she is allergic to penicillin. Patient verbalizes understanding and is agreeable with plan. Pt is hemodynamically stable & in NAD prior to dc.  Final Clinical Impressions(s) / ED Diagnoses   Final diagnoses:    Acute otitis media, right  URI, acute    ED Discharge Orders         Ordered    azithromycin (ZITHROMAX Z-PAK) 250 MG tablet     03/25/18 1752    benzonatate (TESSALON) 100 MG capsule  Every 8 hours     03/25/18 1752           Debroah Baller [place], NP 03/25/18 1757    Charlesetta Shanks, MD 03/26/18 2158

## 2018-03-25 NOTE — Discharge Instructions (Addendum)
Follow up with your doctor in the next few days to be sure your ear infection is improving.

## 2018-03-25 NOTE — ED Triage Notes (Signed)
Pt c/o sinus congestion/sinus pain, productive cough with yellow sputum since last week

## 2018-03-31 ENCOUNTER — Other Ambulatory Visit: Payer: Self-pay

## 2018-03-31 ENCOUNTER — Encounter (HOSPITAL_COMMUNITY): Payer: Self-pay | Admitting: Emergency Medicine

## 2018-03-31 DIAGNOSIS — B349 Viral infection, unspecified: Secondary | ICD-10-CM | POA: Insufficient documentation

## 2018-03-31 DIAGNOSIS — H1031 Unspecified acute conjunctivitis, right eye: Secondary | ICD-10-CM | POA: Insufficient documentation

## 2018-03-31 DIAGNOSIS — Z79899 Other long term (current) drug therapy: Secondary | ICD-10-CM | POA: Insufficient documentation

## 2018-03-31 DIAGNOSIS — E109 Type 1 diabetes mellitus without complications: Secondary | ICD-10-CM | POA: Insufficient documentation

## 2018-03-31 DIAGNOSIS — I10 Essential (primary) hypertension: Secondary | ICD-10-CM | POA: Insufficient documentation

## 2018-03-31 NOTE — ED Triage Notes (Signed)
Pt reports having cough, fever, congestion for the last week that has not been relieved by medications. Last motrin taken at 2000.

## 2018-04-01 ENCOUNTER — Emergency Department (HOSPITAL_COMMUNITY)
Admission: EM | Admit: 2018-04-01 | Discharge: 2018-04-01 | Disposition: A | Discharge status: Home / Self Care | Payer: Self-pay | Attending: Emergency Medicine | Admitting: Emergency Medicine

## 2018-04-01 ENCOUNTER — Emergency Department (HOSPITAL_COMMUNITY): Payer: Self-pay

## 2018-04-01 DIAGNOSIS — I10 Essential (primary) hypertension: Secondary | ICD-10-CM

## 2018-04-01 DIAGNOSIS — B349 Viral infection, unspecified: Secondary | ICD-10-CM

## 2018-04-01 DIAGNOSIS — H1031 Unspecified acute conjunctivitis, right eye: Secondary | ICD-10-CM

## 2018-04-01 MED ORDER — ERYTHROMYCIN 5 MG/GM OP OINT: 1.0000 "application " | TOPICAL_OINTMENT | Freq: Four times a day (QID) | OPHTHALMIC | 1 refills | Status: DC

## 2018-04-01 MED ORDER — IBUPROFEN 800 MG PO TABS
800.0000 mg | ORAL_TABLET | Freq: Once | ORAL | Status: AC
  Administered 2018-04-01: 800 mg via ORAL
  Filled 2018-04-01: qty 1

## 2018-04-01 MED ORDER — BENZONATATE 100 MG PO CAPS: 100.0000 mg | ORAL_CAPSULE | Freq: Three times a day (TID) | ORAL | 0 refills | Status: DC | PRN

## 2018-04-01 NOTE — ED Provider Notes (Signed)
[facility] DEPT Provider Note   CSN: [id number] Arrival date & time: 03/31/18  2218     History   Chief Complaint Chief Complaint  Patient presents with  . Cough  . Fever    HPI Bianca Tapia is a 32 y.o. female.  32 y/o female with hx of IDDM presents to the ED for evaluation of persistent URI symptoms.  Was evaluated 1 week ago for cough, congestion, fever.  States that her symptoms have persisted despite use of azithromycin x5 days.  Also complaining of body aches, ear pressure.  Developed pain to the right side of her chest under her breast 2 days ago.  This is worse with movement and with prolonged coughing.  Not specifically aggravated with deep breathing.  Notes temperature of 102 F prior to arrival.  Took over-the-counter Motrin at [facility].  Has also been using over-the-counter TheraFlu, cough medicine.  Has been able to tolerate food and fluids without difficulty.  She has not experienced any nausea or vomiting, ear drainage, abdominal pain.  The history is provided by the patient. No language interpreter was used.  Cough   Fever   Associated symptoms include cough.    Past Medical History:  Diagnosis Date  . Diabetes mellitus without complication ([place])    Type 1    There are no active problems to display for this patient.   Past Surgical History:  Procedure Laterality Date  . APPENDECTOMY    . c-section    . CHOLECYSTECTOMY       OB History   None      Home Medications    Prior to Admission medications   Medication Sig Start Date End Date Taking? Authorizing Provider  azithromycin (ZITHROMAX Z-PAK) 250 MG tablet Take 2 tablets now and then one tablet PO daily 03/25/18   Ashley Murrain, NP  benzonatate (TESSALON) 100 MG capsule Take 1 capsule (100 mg total) by mouth 3 (three) times daily as needed for cough. 04/01/18   Antonietta Breach, PA-C  erythromycin ophthalmic ointment Place 1 application into the right eye every 6  (six) hours. Place 1/2 inch ribbon of ointment in the affected eye 4 times a day 04/01/18   Antonietta Breach, PA-C  hydrochlorothiazide (HYDRODIURIL) 25 MG tablet Take 1 tablet (25 mg total) by mouth daily. 11/22/17   Khatri, Hina, PA-C  insulin glargine (LANTUS) 100 UNIT/ML injection Inject 10 Units into the skin daily after supper.    [provider]  insulin regular (NOVOLIN R,HUMULIN R) 100 units/mL injection Inject 5 Units into the skin 3 (three) times daily before meals. Sliding scale Pt uses Forensic psychologist, Historical, MD  nitrofurantoin, macrocrystal-monohydrate, (MACROBID) 100 MG capsule Take 1 capsule (100 mg total) by mouth 2 (two) times daily. 11/22/17   Khatri, Hina, PA-C  promethazine (PHENERGAN) 25 MG tablet Take 1 tablet (25 mg total) by mouth every 6 (six) hours as needed for nausea or vomiting. 11/17/17   Molpus, Jenny Reichmann, MD    Family History History reviewed. No pertinent family history.  Social History Social History   Tobacco Use  . Smoking status: Never Smoker  . Smokeless tobacco: Never Used  Substance Use Topics  . Alcohol use: Never    Frequency: Never  . Drug use: Never     Allergies   Other and Penicillins   Review of Systems Review of Systems  Constitutional: Positive for fever.  Respiratory: Positive for cough.    Ten systems reviewed  and are negative for acute change, except as noted in the HPI.    Physical Exam Updated Vital Signs BP (!) 139/96 (BP Location: Right Arm)   Pulse 87   Temp 100 F (37.8 C) (Oral)   Resp 20   Ht 5\' 2"  (1.575 m)   Wt 83.9 kg   LMP 01/29/2018   SpO2 98%   BMI 33.84 kg/m   Physical Exam  Constitutional: She is oriented to person, place, and time. She appears well-developed and well-nourished. No distress.  Nontoxic appearing and in NAD  HENT:  Head: Normocephalic and atraumatic.  Right Ear: Tympanic membrane, external ear and ear canal normal.  Left Ear: Tympanic membrane, external ear and ear canal  normal.  Audible nasal congestion. Clear posterior oropharynx. Tolerating secretions without difficulty.  Eyes: EOM are normal. No scleral icterus.  Conjunctival injection of the right eye without discharge. No pain with EOMs.  Neck: Normal range of motion.  No meningismus  Cardiovascular: Regular rhythm and intact distal pulses.  Mild tachycardia  Pulmonary/Chest: Effort normal. No stridor. No respiratory distress. She has no wheezes. She has no rales.  Lungs CTAB  Abdominal: Soft. She exhibits no distension and no mass. There is no tenderness. There is no guarding.  Soft, obese, nontender.  Musculoskeletal: Normal range of motion.  Neurological: She is alert and oriented to person, place, and time. She exhibits normal muscle tone. Coordination normal.  Skin: Skin is warm and dry. No rash noted. She is not diaphoretic. No erythema. No pallor.  Psychiatric: She has a normal mood and affect. Her behavior is normal.  Nursing note and vitals reviewed.    ED Treatments / Results  Labs (all labs ordered are listed, but only abnormal results are displayed) Labs Reviewed - No data to display  EKG None  Radiology Dg Chest 2 View  Result Date: 04/01/2018 CLINICAL DATA:  33 year old female with fever and congestion. EXAM: CHEST - 2 VIEW COMPARISON:  Chest radiograph dated 03/25/2018 FINDINGS: The heart size and mediastinal contours are within normal limits. Both lungs are clear. The visualized skeletal structures are unremarkable. IMPRESSION: No active cardiopulmonary disease. Electronically Signed   By: Anner Crete M.D.   On: 04/01/2018 01:24    Procedures Procedures (including critical care time)  Medications Ordered in ED Medications  ibuprofen (ADVIL,MOTRIN) tablet 800 mg (800 mg Oral Given 04/01/18 0033)     Initial Impression / Assessment and Plan / ED Course  I have reviewed the triage vital signs and the nursing notes.  Pertinent labs & imaging results that were  available during my care of the patient were reviewed by me and considered in my medical decision making (see chart for details).     32 year old female presents to the emergency department for complaints of persistent upper respiratory symptoms including congestion, cough with body aches.  Noted to be afebrile in the ED.  Reporting temperatures of 102F at home.  Has had symptoms for 1 week.  Was seen 7 days ago and placed on a course of azithromycin which she has completed.  Continues to feel symptomatic.  She is nontoxic in appearance on exam.  Lung sounds clear.  No signs of respiratory distress.    Repeat CXR negative for acute infiltrate.  She does have evidence of nonspecific conjunctivitis.  Will cover with erythromycin ointment.  Symptoms consistent with viral URI, especially given lack of improvement with previous antibiotic course.  Discussed that antibiotics are not indicated for viral infections. Pt will  be discharged with symptomatic treatment.  Return precautions discussed and provided. Patient discharged in stable condition with no unaddressed concerns.   Final Clinical Impressions(s) / ED Diagnoses   Final diagnoses:  Viral illness  Acute conjunctivitis of right eye, unspecified acute conjunctivitis type  Hypertension not at goal    ED Discharge Orders         Ordered    erythromycin ophthalmic ointment  Every 6 hours     04/01/18 0157    benzonatate (TESSALON) 100 MG capsule  3 times daily PRN     04/01/18 0157           Antonietta Breach, PA-C 04/01/18 0509    Ward, Delice Bison, DO 04/01/18 [phone]

## 2018-04-01 NOTE — Discharge Instructions (Signed)
Your chest xray does not show evidence of pneumonia. Your symptoms are likely due to a viral illness. We advise 600mg  ibuprofen every 6 hours as prescribed. You may supplement this with 1000mg  Tylenol every 8 hours, if desired. Be sure you drink fluids to prevent dehydration. Follow-up with a primary care doctor to ensure resolution of symptoms.

## 2018-05-27 ENCOUNTER — Encounter (HOSPITAL_COMMUNITY): Payer: Self-pay | Admitting: Obstetrics and Gynecology

## 2018-05-27 ENCOUNTER — Emergency Department (HOSPITAL_COMMUNITY)
Admission: EM | Admit: 2018-05-27 | Discharge: 2018-05-28 | Disposition: A | Discharge status: Home / Self Care | Payer: Managed Care, Other (non HMO) | Attending: Emergency Medicine | Admitting: Emergency Medicine

## 2018-05-27 ENCOUNTER — Other Ambulatory Visit: Payer: Self-pay

## 2018-05-27 DIAGNOSIS — E109 Type 1 diabetes mellitus without complications: Secondary | ICD-10-CM | POA: Diagnosis not present

## 2018-05-27 DIAGNOSIS — Z79899 Other long term (current) drug therapy: Secondary | ICD-10-CM | POA: Insufficient documentation

## 2018-05-27 DIAGNOSIS — H9201 Otalgia, right ear: Secondary | ICD-10-CM | POA: Diagnosis present

## 2018-05-27 DIAGNOSIS — H70001 Acute mastoiditis without complications, right ear: Secondary | ICD-10-CM | POA: Diagnosis not present

## 2018-05-27 NOTE — ED Triage Notes (Signed)
Pt reports pain in her right ear going up ito the right eye. RN did not see an obvious buildup of wax in the ear. Pt reports she recently did and cleanouts of the ear with peroxide. Pt reports she also feels like her balance is off.

## 2018-05-28 ENCOUNTER — Emergency Department (HOSPITAL_COMMUNITY): Payer: Managed Care, Other (non HMO)

## 2018-05-28 MED ORDER — LEVOFLOXACIN 500 MG PO TABS: 500.0000 mg | ORAL_TABLET | Freq: Every day | ORAL | 0 refills | Status: DC

## 2018-05-28 MED ORDER — FLUCONAZOLE 150 MG PO TABS: ORAL_TABLET | ORAL | 0 refills | Status: DC

## 2018-05-28 MED ORDER — LEVOFLOXACIN 500 MG PO TABS
500.0000 mg | ORAL_TABLET | Freq: Once | ORAL | Status: AC
  Administered 2018-05-28: 500 mg via ORAL
  Filled 2018-05-28: qty 1

## 2018-05-28 NOTE — ED Provider Notes (Signed)
[facility] DEPT Provider Note: [name], MD, FACEP  CSN: [id number] MRN: [id number] ARRIVAL: 05/27/18 at 2215 ROOM: WA06/WA06   CHIEF COMPLAINT  Ear Pain   HISTORY OF PRESENT ILLNESS  05/28/18 12:32 AM Bianca Tapia is a 33 y.o. female with type 1 diabetes.  She is here with a one-week history of pain in her right ear.  It started as a fullness and has worsened.  She was seen 4 days ago at [facility] and started on [place].  Despite this the pain persists and is now radiating to the right side of her face.  She rates her pain as a 7 out of 10.  She has tried cleaning her ear out with peroxide without success.  She feels like her balance is off at times.   Past Medical History:  Diagnosis Date  . Diabetes mellitus without complication ([place])    Type 1    Past Surgical History:  Procedure Laterality Date  . APPENDECTOMY    . c-section    . CHOLECYSTECTOMY      No family history on file.  Social History   Tobacco Use  . Smoking status: Never Smoker  . Smokeless tobacco: Never Used  Substance Use Topics  . Alcohol use: Never    Frequency: Never  . Drug use: Never    Prior to Admission medications   Medication Sig Start Date End Date Taking? Authorizing Provider  azithromycin (ZITHROMAX Z-PAK) 250 MG tablet Take 2 tablets now and then one tablet PO daily 03/25/18   Ashley Murrain, NP  benzonatate (TESSALON) 100 MG capsule Take 1 capsule (100 mg total) by mouth 3 (three) times daily as needed for cough. 04/01/18   Antonietta Breach, PA-C  erythromycin ophthalmic ointment Place 1 application into the right eye every 6 (six) hours. Place 1/2 inch ribbon of ointment in the affected eye 4 times a day 04/01/18   Antonietta Breach, PA-C  hydrochlorothiazide (HYDRODIURIL) 25 MG tablet Take 1 tablet (25 mg total) by mouth daily. 11/22/17   Khatri, Hina, PA-C  insulin glargine (LANTUS) 100 UNIT/ML injection Inject 10 Units into the skin daily after supper.    [provider]  insulin regular (NOVOLIN R,HUMULIN R) 100 units/mL injection Inject 5 Units into the skin 3 (three) times daily before meals. Sliding scale Pt uses Forensic psychologist, Historical, MD  nitrofurantoin, macrocrystal-monohydrate, (MACROBID) 100 MG capsule Take 1 capsule (100 mg total) by mouth 2 (two) times daily. 11/22/17   Khatri, Hina, PA-C  promethazine (PHENERGAN) 25 MG tablet Take 1 tablet (25 mg total) by mouth every 6 (six) hours as needed for nausea or vomiting. 11/17/17   [place] Ice, MD    Allergies Other and Penicillins   REVIEW OF SYSTEMS  Negative except as noted here or in the History of Present Illness.   PHYSICAL EXAMINATION  Initial Vital Signs Blood pressure (!) 142/92, pulse (!) 113, temperature 98.7 F (37.1 C), temperature source Oral, resp. rate 16, last menstrual period 05/06/2018, SpO2 98 %.  Examination General: Well-developed, well-nourished female in no acute distress; appearance consistent with age of record HENT: normocephalic; atraumatic; left TM normal; right TM with air-fluid level; external auditory canals normal without cerumen; tenderness over percussion of right maxillary sinus and right mastoid process Eyes: pupils equal, round and reactive to light; extraocular muscles intact; no conjunctival injection; no periorbital edema or erythema Neck: supple Heart: regular rate and rhythm Lungs: clear to auscultation bilaterally Abdomen: soft; nondistended;  nontender; bowel sounds present Extremities: No deformity; full range of motion; pulses normal Neurologic: Awake, alert and oriented; motor function intact in all extremities and symmetric; no facial droop Skin: Warm and dry Psychiatric: Normal mood and affect   RESULTS  Summary of this visit's results, reviewed by myself:   EKG Interpretation  Date/Time:    Ventricular Rate:    PR Interval:    QRS Duration:   QT Interval:    QTC Calculation:   R Axis:     Text Interpretation:         Laboratory Studies: No results found for this or any previous visit (from the past 24 hour(s)). Imaging Studies: Ct Temporal Bones Wo Contrast  Result Date: 05/28/2018 CLINICAL DATA:  Initial evaluation for acute right ear pain. EXAM: CT TEMPORAL BONES WITHOUT CONTRAST TECHNIQUE: Axial and coronal plane CT imaging of the petrous temporal bones was performed with thin-collimation image reconstruction. No intravenous contrast was administered. Multiplanar CT image reconstructions were also generated. COMPARISON:  None. FINDINGS: Right temporal bone: Auricle and pinna within normal limits. Pre and postauricular soft tissues without acute finding. Right EAC is clear. Tympanic membrane thin and intact. Mild opacity within the right middle ear cavity, primarily involving Prussak's space and the hypotympanum. No scutal erosion to suggest cholesteatoma. Ossicular chain normally formed and intact. Moderate right mastoid effusion with few scattered air-fluid levels. No coalescence. No subperiosteal abscess internal auditory canal normal. Inner ear structures including the cochlea, vestibule, and semi circular canals within normal limits. Facial nerve canal bony covered and intact. Carotid canal and jugular bulb within normal limits. Left temporal bone: Visualized auricle and pinna within normal limits. Pre and postauricular soft tissues demonstrate no acute finding. EAC largely clear. Tympanic membrane thin and intact. Middle ear cavity clear. Ossicular chain normally formed and intact. Mastoid air cells clear. Internal auditory canal normal. Inner ear structures including the cochlea, vestibule, and semi circular canals within normal limits. Facial nerve canal bony covered and intact. Carotid canal and jugular bulb intact. Visualized portions of the brain are unremarkable. Globes and orbital soft tissues within normal limits. Mild scattered mucosal thickening within the ethmoidal air cells and left maxillary sinus.  Visualized paranasal sinuses are otherwise clear. IMPRESSION: 1. Moderate right mastoid effusion with mild scattered opacity within the right middle ear cavity. Clinical correlation for possible acute otomastoiditis recommended. No coalescence. 2. Normal left temporal bone CT. Electronically Signed   By: Jeannine Boga M.D.   On: 05/28/2018 02:01    ED COURSE and MDM  Nursing notes and initial vitals signs, including pulse oximetry, reviewed.  Vitals:   05/27/18 2230 05/27/18 2233  BP: (!) 142/92   Pulse: (!) 113   Resp: 16   Temp:  98.7 F (37.1 C)  TempSrc:  Oral  SpO2: 98%    2:26 AM Patient advised of CT findings.  We will switch from [place] to Levaquin to cover Pseudomonas as she is a diabetic.  Will refer to ENT for further evaluation.  The patient is nontoxic-appearing and I believe this may safely be treated as an outpatient.  She was advised to return if symptoms worsen.  PROCEDURES    ED DIAGNOSES     ICD-10-CM   1. Mastoiditis, acute, right H70.001        Zeanna Sunde, Jenny Reichmann, MD 05/28/18 [phone]

## 2018-05-28 NOTE — ED Notes (Signed)
Pt given ginger ale per request

## 2019-01-21 LAB — HEMOGLOBIN A1C: Hemoglobin A1C: 13.8

## 2019-01-21 LAB — TSH: TSH: 0.81 (ref <=5.90)

## 2019-02-08 LAB — BASIC METABOLIC PANEL
BUN: 35 — AB (ref 4–21)
Creatinine: 1.3 — AB (ref <=1.1)

## 2019-05-28 ENCOUNTER — Encounter: Payer: Self-pay | Admitting: Internal Medicine

## 2019-10-28 LAB — C-PEPTIDE: C-Peptide: 2

## 2019-11-01 ENCOUNTER — Ambulatory Visit (INDEPENDENT_AMBULATORY_CARE_PROVIDER_SITE_OTHER): Payer: Medicaid Other | Admitting: Endocrinology

## 2019-11-01 ENCOUNTER — Encounter: Payer: Self-pay | Admitting: Endocrinology

## 2019-11-01 ENCOUNTER — Other Ambulatory Visit: Payer: Self-pay

## 2019-11-01 VITALS — BP 118/84 | HR 102 | Wt 203.0 lb

## 2019-11-01 DIAGNOSIS — E119 Type 2 diabetes mellitus without complications: Secondary | ICD-10-CM

## 2019-11-01 DIAGNOSIS — Z794 Long term (current) use of insulin: Secondary | ICD-10-CM

## 2019-11-01 DIAGNOSIS — E1142 Type 2 diabetes mellitus with diabetic polyneuropathy: Secondary | ICD-10-CM

## 2019-11-01 LAB — TSH: TSH: 2.4 u[IU]/mL (ref 0.35–4.50)

## 2019-11-01 LAB — BASIC METABOLIC PANEL
BUN: 22 mg/dL (ref 6–23)
CO2: 27 mEq/L (ref 19–32)
Calcium: 9.7 mg/dL (ref 8.4–10.5)
Chloride: 97 mEq/L (ref 96–112)
Creatinine, Ser: 1.37 mg/dL — ABNORMAL HIGH (ref 0.40–1.20)
GFR: 53.91 mL/min — ABNORMAL LOW (ref >60.00)
Glucose, Bld: 411 mg/dL — ABNORMAL HIGH (ref 70–99)
Potassium: 4.4 mEq/L (ref 3.5–5.1)
Sodium: 131 mEq/L — ABNORMAL LOW (ref 135–145)

## 2019-11-01 LAB — POCT GLYCOSYLATED HEMOGLOBIN (HGB A1C): Hemoglobin A1C: 14.5 % — AB (ref 4.0–5.6)

## 2019-11-01 MED ORDER — INSULIN LISPRO (1 UNIT DIAL) 100 UNIT/ML (KWIKPEN): 10.0000 [IU] | PEN_INJECTOR | Freq: Three times a day (TID) | SUBCUTANEOUS | 11 refills | Status: DC

## 2019-11-01 MED ORDER — LANTUS SOLOSTAR 100 UNIT/ML ~~LOC~~ SOPN
10.0000 [IU] | PEN_INJECTOR | Freq: Every day | SUBCUTANEOUS | 99 refills | Status: DC
Start: 2019-11-01 — End: 2023-05-28

## 2019-11-01 NOTE — Patient Instructions (Addendum)
good diet and exercise significantly improve the control of your diabetes.  please let me know if you wish to be referred to a dietician.  high blood sugar is very risky to your health.  you should see an eye doctor and dentist every year.  It is very important to get all recommended vaccinations.  Controlling your blood pressure and cholesterol drastically reduces the damage diabetes does to your body.  Those who smoke should quit.  Please discuss these with your doctor.  check your blood sugar twice a day.  vary the time of day when you check, between before the 3 meals, and at bedtime.  also check if you have symptoms of your blood sugar being too high or too low.  please keep a record of the readings and bring it to your next appointment here (or you can bring the meter itself).  You can write it on any piece of paper.  please call us sooner if your blood sugar goes below 70, or if you have a lot of readings over 200.  I have sent a prescription to your pharmacy, for the 2 insulins.   Please come back for a follow-up appointment in 2 weeks.

## 2019-11-01 NOTE — Progress Notes (Signed)
Subjective:    Patient ID: Bianca Tapia, female    DOB: 09-Jan-1986, 34 y.o.   MRN: [id number]  HPI pt is referred by Dr Theda Sers, for diabetes.  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  She takes insulin intermittently.  She is [facility] getting [facility] and [facility]  She can also get freestyle libre continuous glucose monitor sensors.   Past Medical History:  Diagnosis Date  . Diabetes mellitus without complication ([place])    Type 1    Past Surgical History:  Procedure Laterality Date  . APPENDECTOMY    . c-section    . CHOLECYSTECTOMY      Social History   Socioeconomic History  . Marital status: Married    Spouse name: Not on file  . Number of children: Not on file  . Years of education: Not on file  . Highest education level: Not on file  Occupational History  . Not on file  Tobacco Use  . Smoking status: Never Smoker  . Smokeless tobacco: Never Used  Vaping Use  . Vaping Use: Never used  Substance and Sexual Activity  . Alcohol use: Never  . Drug use: Never  . Sexual activity: Not on file  Other Topics Concern  . Not on file  Social History Narrative  . Not on file   Social Determinants of Health   Financial Resource Strain:   . Difficulty of Paying Living Expenses:   Food Insecurity:   . Worried About Charity fundraiser in the Last Year:   . Arboriculturist in the Last Year:   Transportation Needs:   . Film/video editor (Medical):   Marland Kitchen Lack of Transportation (Non-Medical):   Physical Activity:   . Days of Exercise per Week:   . Minutes of Exercise per Session:   Stress:   . Feeling of Stress :   Social Connections:   . Frequency of  Communication with Friends and Family:   . Frequency of Social Gatherings with Friends and Family:   . Attends Religious Services:   . Active Member of Clubs or Organizations:   . Attends Archivist Meetings:   Marland Kitchen Marital Status:   Intimate Partner Violence:   . Fear of Current or Ex-Partner:   . Emotionally Abused:   Marland Kitchen Physically Abused:   . Sexually Abused:     Current Outpatient Medications on File Prior to Visit  Medication Sig Dispense Refill  . Continuous Blood Gluc Sensor (FREESTYLE LIBRE 2 SENSOR) MISC 1 each by Does not apply route every 14 (fourteen) days.    . Insulin Pen Needle (BD PEN NEEDLE NANO U/F) 32G X 4 MM MISC 1 each by Does not apply route 3 (three) times daily.    . Insulin Syringe-Needle U-100 (B-D INSULIN SYRINGE 1CC/25G) 25G X 5/8" 1 ML MISC 1 each by Does not apply route daily.    . ondansetron (ZOFRAN) 8 MG tablet Take 8 mg by mouth every 8 (eight) hours as needed for nausea or vomiting.    . Polyethylene Glycol 3350 (MIRALAX PO) Take by mouth.    . Ranibizumab (LUCENTIS) 0.3 MG/0.05ML SOLN by Intravitreal route.    Marland Kitchen  BISOPROLOL FUMARATE PO Take 5 mg by mouth daily. (Patient not taking: Reported on 11/01/2019)    . hydrochlorothiazide (HYDRODIURIL) 25 MG tablet Take 1 tablet (25 mg total) by mouth daily. (Patient not taking: Reported on 11/01/2019) 30 tablet 0  . OXcarbazepine (TRILEPTAL) 150 MG tablet Take 150 mg by mouth 2 (two) times daily. (Patient not taking: Reported on 11/01/2019)    . sertraline (ZOLOFT) 25 MG tablet Take 25 mg by mouth daily. (Patient not taking: Reported on 11/01/2019)     No current facility-administered medications on file prior to visit.    Allergies  Allergen Reactions  . Other Anaphylaxis and Itching    Tree nuts  . Penicillins Swelling    Childhood allergy  Has patient had a PCN reaction causing immediate rash, facial/tongue/throat swelling, SOB or lightheadedness with hypotension: Yes Has patient had a PCN reaction  causing severe rash involving mucus membranes or skin necrosis: Yes Has patient had a PCN reaction that required hospitalization: No Has patient had a PCN reaction occurring within the last 10 years: No If all of the above answers are "NO", then may proceed with Cephalosporin use.     Family History  Problem Relation Age of Onset  . Diabetes Mother     BP 118/84 (BP Location: Right Arm, Patient Position: Sitting, Cuff Size: Large)   Pulse (!) 102   Wt 203 lb (92.1 kg)   SpO2 98%   BMI 37.13 kg/m    Review of Systems denies weight loss, chest pain, sob, n/v, urinary frequency, memory loss, and depression.  She has chronically poor vision.       Objective:   Physical Exam VS: see vs page GEN: no distress HEAD: head: no deformity eyes: no periorbital swelling, no proptosis external nose and ears are normal NECK: supple, thyroid is not enlarged CHEST WALL: no deformity.  LUNGS: clear to auscultation CV: reg rate and rhythm, no murmur.   MUSCULOSKELETAL: muscle bulk and strength are grossly normal.  no obvious joint swelling.  gait is normal and steady.   EXTEMITIES: no deformity.  no ulcer on the feet.  feet are of normal color and temp.  Trace bilat leg edema PULSES: dorsalis pedis intact bilat.  no carotid bruit NEURO:  cn 2-12 grossly intact.   readily moves all 4's.  sensation is intact to touch on the feet, but decreased from normal. SKIN:  Normal texture and temperature.  No rash or suspicious lesion is visible.   NODES:  None palpable at the neck PSYCH: alert, well-oriented.  Does not appear anxious nor depressed.  Lab Results  Component Value Date   TSH 0.81 01/21/2019   Lab Results  Component Value Date   HGBA1C 14.5 (A) 11/01/2019   I have reviewed outside records, and summarized: Pt was noted to have elevated A1c, and referred here.  She was noted in ER to have Noncompliance with insulin.       Assessment & Plan:  Insulin-requiring type 2 DM, with PN:  severe exacerbation SDOH: she is not a candidate for multiple daily injections.  Patient Instructions  good diet and exercise significantly improve the control of your diabetes.  please let me know if you wish to be referred to a dietician.  high blood sugar is very risky to your health.  you should see an eye doctor and dentist every year.  It is very important to get all recommended vaccinations.  Controlling your blood pressure and cholesterol drastically reduces the damage diabetes  does to your body.  Those who smoke should quit.  Please discuss these with your doctor.  check your blood sugar twice a day.  vary the time of day when you check, between before the 3 meals, and at bedtime.  also check if you have symptoms of your blood sugar being too high or too low.  please keep a record of the readings and bring it to your next appointment here (or you can bring the meter itself).  You can write it on any piece of paper.  please call us sooner if your blood sugar goes below 70, or if you have a lot of readings over 200.  I have sent a prescription to your pharmacy, for the 2 insulins.   Please come back for a follow-up appointment in 2 weeks.

## 2019-11-03 ENCOUNTER — Encounter: Payer: Self-pay | Admitting: Endocrinology

## 2019-11-03 DIAGNOSIS — E119 Type 2 diabetes mellitus without complications: Secondary | ICD-10-CM | POA: Insufficient documentation

## 2019-11-11 ENCOUNTER — Telehealth: Payer: Self-pay | Admitting: Internal Medicine

## 2019-11-11 NOTE — Telephone Encounter (Signed)
Dawn with [facility] called stating she needed exam notes from the patient's last visit in order for the visit to be paid for - she said she faxed over this request prior to the patient coming in Josem Kaufmann # [id number]) (July 12th)  Fax # [phone]

## 2019-11-12 NOTE — Telephone Encounter (Signed)
Noted printed and will be faxed.

## 2019-11-19 NOTE — Telephone Encounter (Signed)
Dawn called again stating she just needed the chart notes after the patient's appointment on 12/06/19 faxed to [facility] so that they can pay for the patient's appointment. She is faxing [place] the forms so we can scan it into her documents so we will know when patient checks in. Also, in the documentation it will have the exact services that are covered by them.  Fax# [phone]  Routing to [place] per Ammie's request (since this is not anything for the Dr to handle)

## 2019-11-23 ENCOUNTER — Ambulatory Visit: Payer: Medicaid Other | Admitting: Endocrinology

## 2019-11-25 NOTE — Telephone Encounter (Signed)
Have no contact # to call Dawn back. Records have been faxed.

## 2019-12-06 ENCOUNTER — Ambulatory Visit: Payer: Medicaid Other | Admitting: Endocrinology

## 2020-02-02 ENCOUNTER — Other Ambulatory Visit: Payer: Self-pay

## 2020-02-02 ENCOUNTER — Ambulatory Visit
Admission: RE | Admit: 2020-02-02 | Discharge: 2020-02-02 | Disposition: A | Discharge status: Home / Self Care | Payer: Medicaid Other | Source: Ambulatory Visit | Attending: Family Medicine | Admitting: Family Medicine

## 2020-02-02 VITALS — BP 171/102 | HR 99 | Temp 98.3°F | Resp 18

## 2020-02-02 DIAGNOSIS — S81002A Unspecified open wound, left knee, initial encounter: Secondary | ICD-10-CM

## 2020-02-02 DIAGNOSIS — S81802A Unspecified open wound, left lower leg, initial encounter: Secondary | ICD-10-CM

## 2020-02-02 MED ORDER — CLONIDINE HCL 0.1 MG PO TABS
0.1000 mg | ORAL_TABLET | Freq: Every day | ORAL | Status: DC
  Administered 2020-02-02: 0.1 mg via ORAL

## 2020-02-02 MED ORDER — IRBESARTAN-HYDROCHLOROTHIAZIDE 300-12.5 MG PO TABS
1.0000 | ORAL_TABLET | Freq: Every day | ORAL | 1 refills | Status: DC
Start: 2020-02-02 — End: 2023-05-28

## 2020-02-02 MED ORDER — BISOPROLOL FUMARATE 10 MG PO TABS
5.0000 mg | ORAL_TABLET | Freq: Every day | ORAL | 1 refills | Status: DC
Start: 2020-02-02 — End: 2023-05-28

## 2020-02-02 NOTE — Discharge Instructions (Signed)
n
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
## 2020-02-02 NOTE — ED Provider Notes (Addendum)
[facility]    CSN: [id number] Arrival date & time: 02/02/20  1313      History   Chief Complaint Chief Complaint  Patient presents with  . Wound Evaluation    x 1 month    HPI Bianca Tapia is a 34 y.o. female.   Patient here for wound check.  She has type 1 diabetes since childhood.  She has a sore on her left lower leg.  There is no drainage but there is some tenderness.  Patient is blind and cares for the best she can.  She continues to take her insulin but has been off her blood pressure pills for some time due to not having PCP.  HPI  Past Medical History:  Diagnosis Date  . Diabetes ([place])   . Diabetes mellitus without complication ([place])    Type 1    Patient Active Problem List   Diagnosis Date Noted  . Diabetes [facility])     Past Surgical History:  Procedure Laterality Date  . APPENDECTOMY    . c-section    . CHOLECYSTECTOMY      OB History   No obstetric history on file.      Home Medications    Prior to Admission medications   Medication Sig Start Date End Date Taking? Authorizing Provider  BISOPROLOL FUMARATE PO Take 5 mg by mouth daily. Patient not taking: Reported on 11/01/2019    [provider]  Continuous Blood Gluc Sensor (FREESTYLE LIBRE 2 SENSOR) MISC 1 each by Does not apply route every 14 (fourteen) days.    [provider]  hydrochlorothiazide (HYDRODIURIL) 25 MG tablet Take 1 tablet (25 mg total) by mouth daily. Patient not taking: Reported on 11/01/2019 11/22/17   Delia Heady, PA-C  insulin glargine (LANTUS SOLOSTAR) 100 UNIT/ML Solostar Pen Inject 10 Units into the skin at bedtime. 11/01/19   Renato Shin, MD  insulin lispro (HUMALOG KWIKPEN) 100 UNIT/ML KwikPen Inject 0.1 mLs (10 Units total) into the skin 3 (three) times daily with meals. And pen needles 4/day 11/01/19   Renato Shin, MD  Insulin Pen Needle (BD PEN NEEDLE NANO U/F) 32G X 4 MM MISC 1 each by Does not apply route 3 (three) times daily.     [provider]  Insulin Syringe-Needle U-100 (B-D INSULIN SYRINGE 1CC/25G) 25G X 5/8" 1 ML MISC 1 each by Does not apply route daily.    [provider]  ondansetron (ZOFRAN) 8 MG tablet Take 8 mg by mouth every 8 (eight) hours as needed for nausea or vomiting.    [provider]  OXcarbazepine (TRILEPTAL) 150 MG tablet Take 150 mg by mouth 2 (two) times daily. Patient not taking: Reported on 11/01/2019    [provider]  Polyethylene Glycol 3350 (MIRALAX PO) Take by mouth.    [provider]  Ranibizumab (LUCENTIS) 0.3 MG/0.05ML SOLN by Intravitreal route.    [provider]  sertraline (ZOLOFT) 25 MG tablet Take 25 mg by mouth daily. Patient not taking: Reported on 11/01/2019    [provider]    Family History Family History  Problem Relation Age of Onset  . Diabetes Mother     Social History Social History   Tobacco Use  . Smoking status: Never Smoker  . Smokeless tobacco: Never Used  Vaping Use  . Vaping Use: Never used  Substance Use Topics  . Alcohol use: Never  . Drug use: Never     Allergies   Other and Penicillins  Review of Systems Review of Systems  Constitutional: Negative.   Respiratory: Negative for chest tightness.   Skin: Positive for wound.  Neurological: Positive for headaches.  All other systems reviewed and are negative.    Physical Exam Triage Vital Signs ED Triage Vitals  Enc Vitals Group     BP 02/02/20 1321 (!) 176/125     Pulse Rate 02/02/20 1321 99     Resp 02/02/20 1321 18     Temp 02/02/20 1321 98.3 F (36.8 C)     Temp Source 02/02/20 1321 Oral     SpO2 02/02/20 1321 98 %     Weight --      Height --      Head Circumference --      Peak Flow --      Pain Score 02/02/20 1323 8     Pain Loc --      Pain Edu? --      Excl. in [place]? --    No data found.  Updated Vital Signs BP (!) 176/125 (BP Location: Left Arm)   Pulse 99   Temp 98.3 F (36.8 C) (Oral)    Resp 18   SpO2 98%   Visual Acuity Right Eye Distance:   Left Eye Distance:   Bilateral Distance:    Right Eye Near:   Left Eye Near:    Bilateral Near:     Physical Exam Constitutional:      Appearance: Normal appearance.  Cardiovascular:     Rate and Rhythm: Normal rate and regular rhythm.  Pulmonary:     Effort: Pulmonary effort is normal.     Breath sounds: Normal breath sounds.  Skin:    Comments: Circular wound on left lower leg about the size of a dime.  There is no drainage or erythema.  There is some tenderness around the edges.  Neurological:     Mental Status: She is alert.      UC Treatments / Results  Labs (all labs ordered are listed, but only abnormal results are displayed) Labs Reviewed - No data to display  EKG   Radiology No results found.  Procedures Procedures (including critical care time)  Medications Ordered in UC Medications - No data to display  Initial Impression / Assessment and Plan / UC Course  I have reviewed the triage vital signs and the nursing notes.  Pertinent labs & imaging results that were available during my care of the patient were reviewed by me and considered in my medical decision making (see chart for details).     Type 1 diabetes.  Wound left lower leg.  Hypertension patient needs primary care physician to help manage blood pressure and diabetes.  Will cover the wound on leg nail to keep it clean.  Reinstitute antihypertensives bisoprolol and add an angiotensin receptor blocker with diuretic Final Clinical Impressions(s) / UC Diagnoses   Final diagnoses:  None     Discharge Instructions     n   ED Prescriptions    None     PDMP not reviewed this encounter.   Wardell Honour, MD 02/02/20 1345    Wardell Honour, MD 02/02/20 1352

## 2020-03-04 ENCOUNTER — Ambulatory Visit: Payer: Self-pay

## 2020-03-31 ENCOUNTER — Other Ambulatory Visit: Payer: Self-pay | Admitting: Family Medicine

## 2021-02-03 ENCOUNTER — Emergency Department (HOSPITAL_COMMUNITY): Payer: Medicaid Other

## 2021-02-03 ENCOUNTER — Inpatient Hospital Stay (HOSPITAL_COMMUNITY)
Admission: EM | Admit: 2021-02-03 | Discharge: 2021-02-07 | DRG: 914 | Disposition: A | Discharge status: Home / Self Care | Payer: Medicaid Other | Attending: Internal Medicine | Admitting: Internal Medicine

## 2021-02-03 ENCOUNTER — Encounter (HOSPITAL_COMMUNITY): Payer: Self-pay

## 2021-02-03 ENCOUNTER — Other Ambulatory Visit: Payer: Self-pay

## 2021-02-03 ENCOUNTER — Encounter: Payer: Self-pay | Admitting: Internal Medicine

## 2021-02-03 DIAGNOSIS — N183 Chronic kidney disease, stage 3 unspecified: Secondary | ICD-10-CM | POA: Insufficient documentation

## 2021-02-03 DIAGNOSIS — E11319 Type 2 diabetes mellitus with unspecified diabetic retinopathy without macular edema: Secondary | ICD-10-CM | POA: Diagnosis present

## 2021-02-03 DIAGNOSIS — J101 Influenza due to other identified influenza virus with other respiratory manifestations: Secondary | ICD-10-CM

## 2021-02-03 DIAGNOSIS — N179 Acute kidney failure, unspecified: Secondary | ICD-10-CM | POA: Diagnosis present

## 2021-02-03 DIAGNOSIS — T1490XA Injury, unspecified, initial encounter: Secondary | ICD-10-CM

## 2021-02-03 DIAGNOSIS — R8290 Unspecified abnormal findings in urine: Secondary | ICD-10-CM | POA: Diagnosis present

## 2021-02-03 DIAGNOSIS — N289 Disorder of kidney and ureter, unspecified: Secondary | ICD-10-CM | POA: Insufficient documentation

## 2021-02-03 DIAGNOSIS — M546 Pain in thoracic spine: Secondary | ICD-10-CM

## 2021-02-03 DIAGNOSIS — H547 Unspecified visual loss: Secondary | ICD-10-CM | POA: Insufficient documentation

## 2021-02-03 DIAGNOSIS — R29898 Other symptoms and signs involving the musculoskeletal system: Secondary | ICD-10-CM | POA: Diagnosis present

## 2021-02-03 DIAGNOSIS — R202 Paresthesia of skin: Secondary | ICD-10-CM | POA: Diagnosis present

## 2021-02-03 DIAGNOSIS — R6510 Systemic inflammatory response syndrome (SIRS) of non-infectious origin without acute organ dysfunction: Secondary | ICD-10-CM | POA: Diagnosis present

## 2021-02-03 DIAGNOSIS — Z833 Family history of diabetes mellitus: Secondary | ICD-10-CM

## 2021-02-03 DIAGNOSIS — Z87892 Personal history of anaphylaxis: Secondary | ICD-10-CM

## 2021-02-03 DIAGNOSIS — Z91018 Allergy to other foods: Secondary | ICD-10-CM

## 2021-02-03 DIAGNOSIS — I129 Hypertensive chronic kidney disease with stage 1 through stage 4 chronic kidney disease, or unspecified chronic kidney disease: Secondary | ICD-10-CM | POA: Diagnosis present

## 2021-02-03 DIAGNOSIS — N1411 Contrast-induced nephropathy: Secondary | ICD-10-CM | POA: Diagnosis present

## 2021-02-03 DIAGNOSIS — Z79899 Other long term (current) drug therapy: Secondary | ICD-10-CM

## 2021-02-03 DIAGNOSIS — M79672 Pain in left foot: Secondary | ICD-10-CM | POA: Diagnosis present

## 2021-02-03 DIAGNOSIS — E8809 Other disorders of plasma-protein metabolism, not elsewhere classified: Secondary | ICD-10-CM | POA: Diagnosis present

## 2021-02-03 DIAGNOSIS — H548 Legal blindness, as defined in USA: Secondary | ICD-10-CM | POA: Diagnosis present

## 2021-02-03 DIAGNOSIS — W19XXXA Unspecified fall, initial encounter: Secondary | ICD-10-CM | POA: Diagnosis present

## 2021-02-03 DIAGNOSIS — G8220 Paraplegia, unspecified: Secondary | ICD-10-CM | POA: Diagnosis present

## 2021-02-03 DIAGNOSIS — T508X5A Adverse effect of diagnostic agents, initial encounter: Secondary | ICD-10-CM | POA: Diagnosis present

## 2021-02-03 DIAGNOSIS — N189 Chronic kidney disease, unspecified: Secondary | ICD-10-CM | POA: Diagnosis present

## 2021-02-03 DIAGNOSIS — T148XXA Other injury of unspecified body region, initial encounter: Principal | ICD-10-CM | POA: Diagnosis present

## 2021-02-03 DIAGNOSIS — R339 Retention of urine, unspecified: Secondary | ICD-10-CM | POA: Diagnosis present

## 2021-02-03 DIAGNOSIS — Z7985 Long-term (current) use of injectable non-insulin antidiabetic drugs: Secondary | ICD-10-CM

## 2021-02-03 DIAGNOSIS — R200 Anesthesia of skin: Secondary | ICD-10-CM | POA: Diagnosis present

## 2021-02-03 DIAGNOSIS — R531 Weakness: Secondary | ICD-10-CM | POA: Diagnosis present

## 2021-02-03 DIAGNOSIS — E1122 Type 2 diabetes mellitus with diabetic chronic kidney disease: Secondary | ICD-10-CM | POA: Diagnosis present

## 2021-02-03 DIAGNOSIS — Z880 Allergy status to penicillin: Secondary | ICD-10-CM

## 2021-02-03 DIAGNOSIS — I161 Hypertensive emergency: Secondary | ICD-10-CM | POA: Diagnosis present

## 2021-02-03 DIAGNOSIS — E785 Hyperlipidemia, unspecified: Secondary | ICD-10-CM | POA: Diagnosis present

## 2021-02-03 DIAGNOSIS — Z6841 Body Mass Index (BMI) 40.0 and over, adult: Secondary | ICD-10-CM

## 2021-02-03 DIAGNOSIS — Z794 Long term (current) use of insulin: Secondary | ICD-10-CM

## 2021-02-03 DIAGNOSIS — Z20822 Contact with and (suspected) exposure to covid-19: Secondary | ICD-10-CM | POA: Diagnosis present

## 2021-02-03 DIAGNOSIS — N184 Chronic kidney disease, stage 4 (severe): Secondary | ICD-10-CM | POA: Diagnosis present

## 2021-02-03 HISTORY — DX: Disorder of kidney and ureter, unspecified: N28.9

## 2021-02-03 HISTORY — DX: Chronic kidney disease, stage 3 unspecified: N18.30

## 2021-02-03 HISTORY — DX: Unspecified visual loss: H54.7

## 2021-02-03 HISTORY — DX: Type 2 diabetes mellitus with unspecified diabetic retinopathy without macular edema: E11.319

## 2021-02-03 LAB — COMPREHENSIVE METABOLIC PANEL
ALT: 12 U/L (ref 0–44)
AST: 18 U/L (ref 15–41)
Albumin: 2.2 g/dL — ABNORMAL LOW (ref 3.5–5.0)
Alkaline Phosphatase: 74 U/L (ref 38–126)
Anion gap: 8 (ref 5–15)
BUN: 39 mg/dL — ABNORMAL HIGH (ref 6–20)
CO2: 22 mmol/L (ref 22–32)
Calcium: 8.4 mg/dL — ABNORMAL LOW (ref 8.9–10.3)
Chloride: 106 mmol/L (ref 98–111)
Creatinine, Ser: 3.71 mg/dL — ABNORMAL HIGH (ref 0.44–1.00)
GFR, Estimated: 16 mL/min — ABNORMAL LOW (ref >60)
Glucose, Bld: 197 mg/dL — ABNORMAL HIGH (ref 70–99)
Potassium: 3.8 mmol/L (ref 3.5–5.1)
Sodium: 136 mmol/L (ref 135–145)
Total Bilirubin: 0.5 mg/dL (ref 0.3–1.2)
Total Protein: 5.8 g/dL — ABNORMAL LOW (ref 6.5–8.1)

## 2021-02-03 LAB — I-STAT BETA HCG BLOOD, ED (MC, WL, AP ONLY): I-stat hCG, quantitative: <5 m[IU]/mL (ref <5)

## 2021-02-03 LAB — PROTIME-INR
INR: 1 (ref 0.8–1.2)
Prothrombin Time: 12.9 seconds (ref 11.4–15.2)

## 2021-02-03 LAB — I-STAT CHEM 8, ED
BUN: 38 mg/dL — ABNORMAL HIGH (ref 6–20)
Calcium, Ion: 1.19 mmol/L (ref 1.15–1.40)
Chloride: 107 mmol/L (ref 98–111)
Creatinine, Ser: 3.8 mg/dL — ABNORMAL HIGH (ref 0.44–1.00)
Glucose, Bld: 190 mg/dL — ABNORMAL HIGH (ref 70–99)
HCT: 37 % (ref 36.0–46.0)
Hemoglobin: 12.6 g/dL (ref 12.0–15.0)
Potassium: 3.9 mmol/L (ref 3.5–5.1)
Sodium: 140 mmol/L (ref 135–145)
TCO2: 23 mmol/L (ref 22–32)

## 2021-02-03 LAB — URINALYSIS, ROUTINE W REFLEX MICROSCOPIC
Bilirubin Urine: NEGATIVE
Glucose, UA: >=500 mg/dL — AB
Ketones, ur: NEGATIVE mg/dL
Leukocytes,Ua: NEGATIVE
Nitrite: NEGATIVE
Protein, ur: >=300 mg/dL — AB
Specific Gravity, Urine: 1.018 (ref 1.005–1.030)
pH: 6 (ref 5.0–8.0)

## 2021-02-03 LAB — CBC
HCT: 37.1 % (ref 36.0–46.0)
Hemoglobin: 12.8 g/dL (ref 12.0–15.0)
MCH: 29.8 pg (ref 26.0–34.0)
MCHC: 34.5 g/dL (ref 30.0–36.0)
MCV: 86.5 fL (ref 80.0–100.0)
Platelets: 231 10*3/uL (ref 150–400)
RBC: 4.29 MIL/uL (ref 3.87–5.11)
RDW: 12 % (ref 11.5–15.5)
WBC: 7.8 10*3/uL (ref 4.0–10.5)
nRBC: 0 % (ref 0.0–0.2)

## 2021-02-03 LAB — LACTIC ACID, PLASMA: Lactic Acid, Venous: 1.2 mmol/L (ref 0.5–1.9)

## 2021-02-03 LAB — RESP PANEL BY RT-PCR (FLU A&B, COVID) ARPGX2
Influenza A by PCR: POSITIVE — AB
Influenza B by PCR: NEGATIVE
SARS Coronavirus 2 by RT PCR: NEGATIVE

## 2021-02-03 LAB — CK: Total CK: 195 U/L (ref 38–234)

## 2021-02-03 LAB — SAMPLE TO BLOOD BANK

## 2021-02-03 LAB — GLUCOSE, CAPILLARY: Glucose-Capillary: 314 mg/dL — ABNORMAL HIGH (ref 70–99)

## 2021-02-03 LAB — MAGNESIUM: Magnesium: 1.9 mg/dL (ref 1.7–2.4)

## 2021-02-03 LAB — ETHANOL: Alcohol, Ethyl (B): <10 mg/dL (ref <10)

## 2021-02-03 LAB — HIV ANTIBODY (ROUTINE TESTING W REFLEX): HIV Screen 4th Generation wRfx: NONREACTIVE

## 2021-02-03 LAB — CBG MONITORING, ED
Glucose-Capillary: 389 mg/dL — ABNORMAL HIGH (ref 70–99)
Glucose-Capillary: 404 mg/dL — ABNORMAL HIGH (ref 70–99)
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
## 2021-02-03 MED ORDER — INSULIN GLARGINE-YFGN 100 UNIT/ML ~~LOC~~ SOLN
20.0000 [IU] | Freq: Two times a day (BID) | SUBCUTANEOUS | Status: DC
  Administered 2021-02-03 – 2021-02-04 (×3): 20 [IU] via SUBCUTANEOUS
  Filled 2021-02-03 (×7): qty 0.2

## 2021-02-03 MED ORDER — IOHEXOL 350 MG/ML SOLN
75.0000 mL | Freq: Once | INTRAVENOUS | Status: AC | PRN
  Administered 2021-02-03: 75 mL via INTRAVENOUS

## 2021-02-03 MED ORDER — SODIUM CHLORIDE 0.9 % IV SOLN
2.0000 g | INTRAVENOUS | Status: DC
  Administered 2021-02-03 – 2021-02-05 (×3): 2 g via INTRAVENOUS
  Filled 2021-02-03 (×3): qty 20

## 2021-02-03 MED ORDER — LACTATED RINGERS IV BOLUS
500.0000 mL | Freq: Once | INTRAVENOUS | Status: AC
  Administered 2021-02-03: 500 mL via INTRAVENOUS

## 2021-02-03 MED ORDER — SODIUM CHLORIDE 0.9 % IV SOLN: INTRAVENOUS | Status: DC

## 2021-02-03 MED ORDER — ONDANSETRON HCL 4 MG/2ML IJ SOLN
4.0000 mg | Freq: Once | INTRAMUSCULAR | Status: AC
  Administered 2021-02-03: 4 mg via INTRAVENOUS
  Filled 2021-02-03: qty 2

## 2021-02-03 MED ORDER — INSULIN ASPART 100 UNIT/ML IJ SOLN
0.0000 [IU] | Freq: Three times a day (TID) | INTRAMUSCULAR | Status: DC
  Administered 2021-02-03: 20 [IU] via SUBCUTANEOUS

## 2021-02-03 MED ORDER — AMLODIPINE BESYLATE 5 MG PO TABS
2.5000 mg | ORAL_TABLET | Freq: Every day | ORAL | Status: DC | PRN
  Administered 2021-02-03: 2.5 mg via ORAL
  Filled 2021-02-03 (×2): qty 1

## 2021-02-03 MED ORDER — CEFAZOLIN SODIUM-DEXTROSE 2-4 GM/100ML-% IV SOLN
2.0000 g | Freq: Once | INTRAVENOUS | Status: DC
  Filled 2021-02-03: qty 100

## 2021-02-03 MED ORDER — LABETALOL HCL 5 MG/ML IV SOLN
10.0000 mg | INTRAVENOUS | Status: DC | PRN
  Administered 2021-02-04: 10 mg via INTRAVENOUS
  Filled 2021-02-03: qty 4

## 2021-02-03 MED ORDER — INSULIN ASPART 100 UNIT/ML IJ SOLN
20.0000 [IU] | Freq: Three times a day (TID) | INTRAMUSCULAR | Status: DC
  Administered 2021-02-03 – 2021-02-04 (×2): 20 [IU] via SUBCUTANEOUS

## 2021-02-03 MED ORDER — ONDANSETRON HCL 4 MG/2ML IJ SOLN
4.0000 mg | Freq: Four times a day (QID) | INTRAMUSCULAR | Status: DC | PRN
  Administered 2021-02-03 – 2021-02-06 (×5): 4 mg via INTRAVENOUS
  Filled 2021-02-03 (×5): qty 2

## 2021-02-03 MED ORDER — INSULIN ASPART 100 UNIT/ML IJ SOLN
0.0000 [IU] | Freq: Three times a day (TID) | INTRAMUSCULAR | Status: DC
  Administered 2021-02-04: 3 [IU] via SUBCUTANEOUS
  Administered 2021-02-06: 2 [IU] via SUBCUTANEOUS

## 2021-02-03 MED ORDER — DEXAMETHASONE SODIUM PHOSPHATE 10 MG/ML IJ SOLN
10.0000 mg | Freq: Once | INTRAMUSCULAR | Status: AC
  Administered 2021-02-03: 10 mg via INTRAVENOUS
  Filled 2021-02-03: qty 1

## 2021-02-03 MED ORDER — ONDANSETRON HCL 4 MG PO TABS
4.0000 mg | ORAL_TABLET | Freq: Four times a day (QID) | ORAL | Status: DC | PRN
  Administered 2021-02-05: 4 mg via ORAL
  Filled 2021-02-03: qty 1

## 2021-02-03 MED ORDER — HYDRALAZINE HCL 20 MG/ML IJ SOLN: 10.0000 mg | INTRAMUSCULAR | Status: DC | PRN

## 2021-02-03 MED ORDER — HEPARIN SODIUM (PORCINE) 5000 UNIT/ML IJ SOLN
5000.0000 [IU] | Freq: Three times a day (TID) | INTRAMUSCULAR | Status: DC
  Administered 2021-02-03 – 2021-02-07 (×13): 5000 [IU] via SUBCUTANEOUS
  Filled 2021-02-03 (×13): qty 1

## 2021-02-03 MED ORDER — LIDOCAINE 5 % EX PTCH
1.0000 | MEDICATED_PATCH | CUTANEOUS | Status: DC
  Administered 2021-02-03 – 2021-02-07 (×5): 1 via TRANSDERMAL
  Filled 2021-02-03 (×5): qty 1

## 2021-02-03 MED ORDER — ATORVASTATIN CALCIUM 40 MG PO TABS
40.0000 mg | ORAL_TABLET | Freq: Every day | ORAL | Status: DC
  Administered 2021-02-03 – 2021-02-06 (×4): 40 mg via ORAL
  Filled 2021-02-03 (×4): qty 1

## 2021-02-03 MED ORDER — ACETAMINOPHEN 650 MG RE SUPP: 650.0000 mg | Freq: Four times a day (QID) | RECTAL | Status: DC | PRN

## 2021-02-03 MED ORDER — SODIUM CHLORIDE 0.9 % IV SOLN: Freq: Once | INTRAVENOUS | Status: AC

## 2021-02-03 MED ORDER — FENTANYL CITRATE PF 50 MCG/ML IJ SOSY
50.0000 ug | PREFILLED_SYRINGE | Freq: Once | INTRAMUSCULAR | Status: AC
  Administered 2021-02-03: 50 ug via INTRAVENOUS
  Filled 2021-02-03: qty 1

## 2021-02-03 MED ORDER — LABETALOL HCL 5 MG/ML IV SOLN
5.0000 mg | Freq: Once | INTRAVENOUS | Status: AC
  Administered 2021-02-03: 5 mg via INTRAVENOUS
  Filled 2021-02-03: qty 4

## 2021-02-03 MED ORDER — HYDROCODONE-ACETAMINOPHEN 5-325 MG PO TABS
1.0000 | ORAL_TABLET | Freq: Four times a day (QID) | ORAL | Status: DC | PRN
Start: 2021-02-03 — End: 2021-02-08
  Administered 2021-02-03 – 2021-02-04 (×2): 1 via ORAL
  Filled 2021-02-03 (×2): qty 1

## 2021-02-03 MED ORDER — ACETAMINOPHEN 325 MG PO TABS
650.0000 mg | ORAL_TABLET | Freq: Four times a day (QID) | ORAL | Status: DC | PRN
  Administered 2021-02-03 – 2021-02-04 (×3): 650 mg via ORAL
  Filled 2021-02-03 (×3): qty 2
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
## 2021-02-03 NOTE — ED Notes (Signed)
Pt CBG 404. Sarah RN, notified

## 2021-02-03 NOTE — ED Notes (Signed)
Patient transported to CT 

## 2021-02-03 NOTE — ED Notes (Signed)
Patient transported to MRI 

## 2021-02-03 NOTE — ED Notes (Signed)
Pt has returned from MRI. 

## 2021-02-03 NOTE — ED Notes (Signed)
Pt just now reported she is able to move her legs bilaterally. She was able to wiggle her toes bilaterally as well as + sensation to her feet. Sensation is decreased but is there. Dr. Randal Buba and Jarrett Soho, [place] informed.

## 2021-02-03 NOTE — ED Notes (Signed)
Pt's husband at bedside.
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
## 2021-02-03 NOTE — ED Notes (Signed)
Pt is still at MRI
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
## 2021-02-03 NOTE — ED Notes (Signed)
Bladder scan shows 422cc. Purwik placed. If no output, will in and out cath.

## 2021-02-03 NOTE — ED Notes (Signed)
Pt at CT
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
## 2021-02-04 DIAGNOSIS — R8290 Unspecified abnormal findings in urine: Secondary | ICD-10-CM

## 2021-02-04 DIAGNOSIS — N179 Acute kidney failure, unspecified: Secondary | ICD-10-CM | POA: Diagnosis not present

## 2021-02-04 DIAGNOSIS — R202 Paresthesia of skin: Secondary | ICD-10-CM

## 2021-02-04 DIAGNOSIS — N189 Chronic kidney disease, unspecified: Secondary | ICD-10-CM

## 2021-02-04 LAB — CBC
HCT: 33.5 % — ABNORMAL LOW (ref 36.0–46.0)
Hemoglobin: 11.2 g/dL — ABNORMAL LOW (ref 12.0–15.0)
MCH: 29.3 pg (ref 26.0–34.0)
MCHC: 33.4 g/dL (ref 30.0–36.0)
MCV: 87.7 fL (ref 80.0–100.0)
Platelets: 229 10*3/uL (ref 150–400)
RBC: 3.82 MIL/uL — ABNORMAL LOW (ref 3.87–5.11)
RDW: 12.1 % (ref 11.5–15.5)
WBC: 5.3 10*3/uL (ref 4.0–10.5)
nRBC: 0 % (ref 0.0–0.2)

## 2021-02-04 LAB — BASIC METABOLIC PANEL
Anion gap: 10 (ref 5–15)
BUN: 44 mg/dL — ABNORMAL HIGH (ref 6–20)
CO2: 18 mmol/L — ABNORMAL LOW (ref 22–32)
Calcium: 7.8 mg/dL — ABNORMAL LOW (ref 8.9–10.3)
Chloride: 108 mmol/L (ref 98–111)
Creatinine, Ser: 4.11 mg/dL — ABNORMAL HIGH (ref 0.44–1.00)
GFR, Estimated: 14 mL/min — ABNORMAL LOW (ref >60)
Glucose, Bld: 244 mg/dL — ABNORMAL HIGH (ref 70–99)
Potassium: 3.8 mmol/L (ref 3.5–5.1)
Sodium: 136 mmol/L (ref 135–145)

## 2021-02-04 LAB — GLUCOSE, CAPILLARY
Glucose-Capillary: 204 mg/dL — ABNORMAL HIGH (ref 70–99)
Glucose-Capillary: 157 mg/dL — ABNORMAL HIGH (ref 70–99)
Glucose-Capillary: 74 mg/dL (ref 70–99)
Glucose-Capillary: 71 mg/dL (ref 70–99)
Glucose-Capillary: 92 mg/dL (ref 70–99)
Glucose-Capillary: 94 mg/dL (ref 70–99)

## 2021-02-04 LAB — PREALBUMIN: Prealbumin: 22.2 mg/dL (ref 18–38)

## 2021-02-04 LAB — TSH: TSH: 1.244 u[IU]/mL (ref 0.350–4.500)

## 2021-02-04 LAB — HEMOGLOBIN A1C
Hgb A1c MFr Bld: 10.1 % — ABNORMAL HIGH (ref 4.8–5.6)
Mean Plasma Glucose: 243.17 mg/dL

## 2021-02-04 MED ORDER — CHLORHEXIDINE GLUCONATE CLOTH 2 % EX PADS
6.0000 | MEDICATED_PAD | Freq: Every day | CUTANEOUS | Status: DC
  Administered 2021-02-04: 6 via TOPICAL

## 2021-02-04 MED ORDER — AMLODIPINE BESYLATE 5 MG PO TABS
5.0000 mg | ORAL_TABLET | Freq: Every day | ORAL | Status: DC
  Administered 2021-02-04 – 2021-02-05 (×2): 5 mg via ORAL
  Filled 2021-02-04 (×2): qty 1

## 2021-02-04 MED ORDER — OSELTAMIVIR PHOSPHATE 30 MG PO CAPS
30.0000 mg | ORAL_CAPSULE | Freq: Every day | ORAL | Status: DC
Start: 2021-02-04 — End: 2021-02-08
  Administered 2021-02-04 – 2021-02-07 (×4): 30 mg via ORAL
  Filled 2021-02-04 (×4): qty 1
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
## 2021-02-04 NOTE — Progress Notes (Signed)
PHARMACY NOTE:  ANTIMICROBIAL RENAL DOSAGE ADJUSTMENT  Current antimicrobial regimen includes a mismatch between antimicrobial dosage and estimated renal function.  As per policy approved by the Pharmacy & Therapeutics and Medical Executive Committees, the antimicrobial dosage will be adjusted accordingly.  Current antimicrobial dosage:  Tamiflu '75mg'$  PO BID  Indication: Influenza A  Renal Function:  Estimated Creatinine Clearance: 22.5 mL/min (A) (by C-G formula based on SCr of 4.11 mg/dL (H)). '[]'$      On intermittent HD, scheduled: '[]'$      On CRRT    Antimicrobial dosage has been changed to:  Tamiflu '30mg'$  PO daily    Thank you for allowing pharmacy to be a part of this patient's care.  Wynona Neat, PharmD, BCPS  02/04/2021 7:40 AM

## 2021-02-04 NOTE — Progress Notes (Signed)
Pt admitted to room 28 after transfer from ER to floor.  Pt VS taken, tele initiated and PIV patent running 32m NS.  Pt educated on how to call the nurse for help. Pt reports discomfort in abdomen; bladder scan will be done q4 to assess for retention. MD communicated with regarding POC glucose and bladder scan.  Pt has no questions and is comfortable after care provided.

## 2021-02-04 NOTE — Progress Notes (Signed)
   02/03/21 2145  Assess: MEWS Score  Temp 98.5 F (36.9 C)  BP 132/72  Pulse Rate (!) 120  Resp 18  Level of Consciousness Alert  SpO2 100 %  O2 Device Room Air  Assess: if the MEWS score is Yellow or Red  Were vital signs taken at a resting state? Yes  Focused Assessment No change from prior assessment  Early Detection of Sepsis Score *See Row Information* Low  MEWS guidelines implemented *See Row Information* Yes  Treat  MEWS Interventions Escalated (See documentation below)  Pain Scale 0-10  Pain Score 2  Pain Type Acute pain  Pain Location Back  Escalate  MEWS: Escalate Yellow: discuss with charge nurse/RN and consider discussing with provider and RRT  Notify: Charge Nurse/RN  Name of Charge Nurse/RN Notified Nikki, RN  Date Charge Nurse/RN Notified 02/03/21  Time Charge Nurse/RN Notified 2200  Notify: Provider  Provider Name/Title Rathore, MD  Date Provider Notified 02/03/21  Time Provider Notified 2230  Notification Type Page  Notification Reason Other (Comment) (continued yellow)  Provider response No new orders  Date of Provider Response 02/03/21  Time of Provider Response 2210  Document  Patient Outcome Other (Comment) (continue with yellow MEWS)  Progress note created (see row info) Yes

## 2021-02-04 NOTE — Progress Notes (Signed)
Patient bladder scan resulted in 320 ml; I&O ordered by MD and output is 350 ml.  Next scan 350 or greater will require order for foley.

## 2021-02-05 DIAGNOSIS — N179 Acute kidney failure, unspecified: Secondary | ICD-10-CM | POA: Diagnosis present

## 2021-02-05 DIAGNOSIS — R202 Paresthesia of skin: Secondary | ICD-10-CM | POA: Diagnosis present

## 2021-02-05 DIAGNOSIS — Z794 Long term (current) use of insulin: Secondary | ICD-10-CM

## 2021-02-05 DIAGNOSIS — T1490XA Injury, unspecified, initial encounter: Secondary | ICD-10-CM | POA: Diagnosis present

## 2021-02-05 DIAGNOSIS — J101 Influenza due to other identified influenza virus with other respiratory manifestations: Secondary | ICD-10-CM | POA: Diagnosis present

## 2021-02-05 DIAGNOSIS — M79672 Pain in left foot: Secondary | ICD-10-CM | POA: Diagnosis present

## 2021-02-05 DIAGNOSIS — Z6841 Body Mass Index (BMI) 40.0 and over, adult: Secondary | ICD-10-CM | POA: Diagnosis not present

## 2021-02-05 DIAGNOSIS — E8809 Other disorders of plasma-protein metabolism, not elsewhere classified: Secondary | ICD-10-CM | POA: Diagnosis present

## 2021-02-05 DIAGNOSIS — I129 Hypertensive chronic kidney disease with stage 1 through stage 4 chronic kidney disease, or unspecified chronic kidney disease: Secondary | ICD-10-CM | POA: Diagnosis present

## 2021-02-05 DIAGNOSIS — E11319 Type 2 diabetes mellitus with unspecified diabetic retinopathy without macular edema: Secondary | ICD-10-CM | POA: Diagnosis present

## 2021-02-05 DIAGNOSIS — E1122 Type 2 diabetes mellitus with diabetic chronic kidney disease: Secondary | ICD-10-CM | POA: Diagnosis present

## 2021-02-05 DIAGNOSIS — W19XXXA Unspecified fall, initial encounter: Secondary | ICD-10-CM | POA: Diagnosis present

## 2021-02-05 DIAGNOSIS — R531 Weakness: Secondary | ICD-10-CM | POA: Diagnosis present

## 2021-02-05 DIAGNOSIS — T148XXA Other injury of unspecified body region, initial encounter: Secondary | ICD-10-CM | POA: Diagnosis present

## 2021-02-05 DIAGNOSIS — Z20822 Contact with and (suspected) exposure to covid-19: Secondary | ICD-10-CM | POA: Diagnosis present

## 2021-02-05 DIAGNOSIS — R200 Anesthesia of skin: Secondary | ICD-10-CM | POA: Diagnosis present

## 2021-02-05 DIAGNOSIS — G8220 Paraplegia, unspecified: Secondary | ICD-10-CM | POA: Diagnosis present

## 2021-02-05 DIAGNOSIS — N1411 Contrast-induced nephropathy: Secondary | ICD-10-CM | POA: Diagnosis present

## 2021-02-05 DIAGNOSIS — T508X5A Adverse effect of diagnostic agents, initial encounter: Secondary | ICD-10-CM | POA: Diagnosis present

## 2021-02-05 DIAGNOSIS — R339 Retention of urine, unspecified: Secondary | ICD-10-CM | POA: Diagnosis present

## 2021-02-05 DIAGNOSIS — H548 Legal blindness, as defined in USA: Secondary | ICD-10-CM | POA: Diagnosis present

## 2021-02-05 DIAGNOSIS — I161 Hypertensive emergency: Secondary | ICD-10-CM | POA: Diagnosis present

## 2021-02-05 DIAGNOSIS — N184 Chronic kidney disease, stage 4 (severe): Secondary | ICD-10-CM | POA: Diagnosis present

## 2021-02-05 DIAGNOSIS — R6510 Systemic inflammatory response syndrome (SIRS) of non-infectious origin without acute organ dysfunction: Secondary | ICD-10-CM | POA: Diagnosis present

## 2021-02-05 DIAGNOSIS — M546 Pain in thoracic spine: Secondary | ICD-10-CM | POA: Diagnosis present

## 2021-02-05 DIAGNOSIS — E785 Hyperlipidemia, unspecified: Secondary | ICD-10-CM | POA: Diagnosis present

## 2021-02-05 LAB — BASIC METABOLIC PANEL
Anion gap: 8 (ref 5–15)
BUN: 35 mg/dL — ABNORMAL HIGH (ref 6–20)
CO2: 18 mmol/L — ABNORMAL LOW (ref 22–32)
Calcium: 7.7 mg/dL — ABNORMAL LOW (ref 8.9–10.3)
Chloride: 110 mmol/L (ref 98–111)
Creatinine, Ser: 4.04 mg/dL — ABNORMAL HIGH (ref 0.44–1.00)
GFR, Estimated: 14 mL/min — ABNORMAL LOW (ref >60)
Glucose, Bld: 73 mg/dL (ref 70–99)
Potassium: 3.9 mmol/L (ref 3.5–5.1)
Sodium: 136 mmol/L (ref 135–145)

## 2021-02-05 LAB — GLUCOSE, CAPILLARY
Glucose-Capillary: 76 mg/dL (ref 70–99)
Glucose-Capillary: 106 mg/dL — ABNORMAL HIGH (ref 70–99)
Glucose-Capillary: 103 mg/dL — ABNORMAL HIGH (ref 70–99)
Glucose-Capillary: 88 mg/dL (ref 70–99)

## 2021-02-05 MED ORDER — AMLODIPINE BESYLATE 5 MG PO TABS
5.0000 mg | ORAL_TABLET | ORAL | Status: AC
  Administered 2021-02-05: 5 mg via ORAL
  Filled 2021-02-05: qty 1

## 2021-02-05 MED ORDER — INSULIN ASPART 100 UNIT/ML IJ SOLN
12.0000 [IU] | Freq: Three times a day (TID) | INTRAMUSCULAR | Status: DC
  Administered 2021-02-05 – 2021-02-06 (×3): 12 [IU] via SUBCUTANEOUS

## 2021-02-05 MED ORDER — AMLODIPINE BESYLATE 10 MG PO TABS
10.0000 mg | ORAL_TABLET | Freq: Every day | ORAL | Status: DC
  Administered 2021-02-06 – 2021-02-07 (×2): 10 mg via ORAL
  Filled 2021-02-05 (×2): qty 1

## 2021-02-05 MED ORDER — INSULIN GLARGINE-YFGN 100 UNIT/ML ~~LOC~~ SOLN
12.0000 [IU] | Freq: Two times a day (BID) | SUBCUTANEOUS | Status: DC
  Administered 2021-02-05 – 2021-02-06 (×4): 12 [IU] via SUBCUTANEOUS
  Filled 2021-02-05 (×5): qty 0.12

## 2021-02-05 NOTE — Progress Notes (Signed)
Pt slept well overnight. Encouraged pt to order breakfast. Pt reports her headache is non existent at this time.
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
## 2021-02-05 NOTE — Progress Notes (Signed)
Inpatient Rehab Admissions Coordinator:   Per PT patient was screened for CIR candidacy by SUBJECTIVE: .At this time, OT is recommending no follow up and pt. Does not demonstrate need for intensive rehab in two or more disciplines. I  will not pursue a rehab consult for this Pt.   Recommend other rehab venues to be pursued.  Please contact me with any questions.  Clemens Catholic, [place], [place] Admissions Coordinator  [phone] ([place]) [phone] (office)
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
## 2021-02-06 DIAGNOSIS — N179 Acute kidney failure, unspecified: Secondary | ICD-10-CM | POA: Diagnosis not present

## 2021-02-06 DIAGNOSIS — R202 Paresthesia of skin: Secondary | ICD-10-CM | POA: Diagnosis not present

## 2021-02-06 DIAGNOSIS — T1490XA Injury, unspecified, initial encounter: Secondary | ICD-10-CM

## 2021-02-06 DIAGNOSIS — E11319 Type 2 diabetes mellitus with unspecified diabetic retinopathy without macular edema: Secondary | ICD-10-CM | POA: Diagnosis not present

## 2021-02-06 LAB — GLUCOSE, CAPILLARY
Glucose-Capillary: 105 mg/dL — ABNORMAL HIGH (ref 70–99)
Glucose-Capillary: 105 mg/dL — ABNORMAL HIGH (ref 70–99)
Glucose-Capillary: 110 mg/dL — ABNORMAL HIGH (ref 70–99)
Glucose-Capillary: 102 mg/dL — ABNORMAL HIGH (ref 70–99)
Glucose-Capillary: 123 mg/dL — ABNORMAL HIGH (ref 70–99)
Glucose-Capillary: 48 mg/dL — ABNORMAL LOW (ref 70–99)
Glucose-Capillary: 69 mg/dL — ABNORMAL LOW (ref 70–99)
Glucose-Capillary: 80 mg/dL (ref 70–99)

## 2021-02-06 LAB — BASIC METABOLIC PANEL
Anion gap: 6 (ref 5–15)
BUN: 34 mg/dL — ABNORMAL HIGH (ref 6–20)
CO2: 21 mmol/L — ABNORMAL LOW (ref 22–32)
Calcium: 8 mg/dL — ABNORMAL LOW (ref 8.9–10.3)
Chloride: 110 mmol/L (ref 98–111)
Creatinine, Ser: 4.02 mg/dL — ABNORMAL HIGH (ref 0.44–1.00)
GFR, Estimated: 14 mL/min — ABNORMAL LOW (ref >60)
Glucose, Bld: 109 mg/dL — ABNORMAL HIGH (ref 70–99)
Potassium: 3.7 mmol/L (ref 3.5–5.1)
Sodium: 137 mmol/L (ref 135–145)
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
## 2021-02-07 ENCOUNTER — Other Ambulatory Visit (HOSPITAL_COMMUNITY): Payer: Self-pay

## 2021-02-07 DIAGNOSIS — R202 Paresthesia of skin: Secondary | ICD-10-CM | POA: Diagnosis not present

## 2021-02-07 DIAGNOSIS — M546 Pain in thoracic spine: Secondary | ICD-10-CM

## 2021-02-07 DIAGNOSIS — E11319 Type 2 diabetes mellitus with unspecified diabetic retinopathy without macular edema: Secondary | ICD-10-CM | POA: Diagnosis not present

## 2021-02-07 DIAGNOSIS — N179 Acute kidney failure, unspecified: Secondary | ICD-10-CM | POA: Diagnosis not present

## 2021-02-07 LAB — BASIC METABOLIC PANEL
Anion gap: 9 (ref 5–15)
BUN: 31 mg/dL — ABNORMAL HIGH (ref 6–20)
CO2: 20 mmol/L — ABNORMAL LOW (ref 22–32)
Calcium: 7.8 mg/dL — ABNORMAL LOW (ref 8.9–10.3)
Chloride: 103 mmol/L (ref 98–111)
Creatinine, Ser: 3.84 mg/dL — ABNORMAL HIGH (ref 0.44–1.00)
GFR, Estimated: 15 mL/min — ABNORMAL LOW (ref >60)
Glucose, Bld: 123 mg/dL — ABNORMAL HIGH (ref 70–99)
Potassium: 3.4 mmol/L — ABNORMAL LOW (ref 3.5–5.1)
Sodium: 132 mmol/L — ABNORMAL LOW (ref 135–145)

## 2021-02-07 LAB — GLUCOSE, CAPILLARY
Glucose-Capillary: 130 mg/dL — ABNORMAL HIGH (ref 70–99)
Glucose-Capillary: 172 mg/dL — ABNORMAL HIGH (ref 70–99)
Glucose-Capillary: 148 mg/dL — ABNORMAL HIGH (ref 70–99)
Glucose-Capillary: 200 mg/dL — ABNORMAL HIGH (ref 70–99)
Glucose-Capillary: 218 mg/dL — ABNORMAL HIGH (ref 70–99)

## 2021-02-07 MED ORDER — INSULIN GLARGINE-YFGN 100 UNIT/ML ~~LOC~~ SOLN
8.0000 [IU] | Freq: Every day | SUBCUTANEOUS | Status: DC
  Administered 2021-02-07: 8 [IU] via SUBCUTANEOUS
  Filled 2021-02-07: qty 0.08

## 2021-02-07 MED ORDER — INSULIN ASPART 100 UNIT/ML IJ SOLN
0.0000 [IU] | Freq: Three times a day (TID) | INTRAMUSCULAR | Status: DC
  Administered 2021-02-07: 2 [IU] via SUBCUTANEOUS

## 2021-02-07 MED ORDER — LANTUS SOLOSTAR 100 UNIT/ML ~~LOC~~ SOPN: 12.0000 [IU] | PEN_INJECTOR | Freq: Every day | SUBCUTANEOUS | 11 refills | Status: AC

## 2021-02-07 MED ORDER — POTASSIUM CHLORIDE CRYS ER 20 MEQ PO TBCR
40.0000 meq | EXTENDED_RELEASE_TABLET | Freq: Once | ORAL | Status: AC
  Administered 2021-02-07: 40 meq via ORAL
  Filled 2021-02-07: qty 2

## 2021-02-07 MED ORDER — AMLODIPINE BESYLATE 10 MG PO TABS
10.0000 mg | ORAL_TABLET | Freq: Every day | ORAL | 0 refills | Status: AC
  Filled 2021-02-07: qty 30, 30d supply, fill #0

## 2021-02-07 MED ORDER — INSULIN ASPART 100 UNIT/ML IJ SOLN
0.0000 [IU] | INTRAMUSCULAR | Status: DC
  Administered 2021-02-07: 2 [IU] via SUBCUTANEOUS

## 2021-02-07 NOTE — Discharge Summary (Addendum)
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 02/07/2021 0135   GLUCOSE 123 (H) 02/07/2021 0135   BUN 31 (H) 02/07/2021 0135   CREATININE 3.84 (H) 02/07/2021 0135   CALCIUM 7.8 (L) 02/07/2021 0135   PROT 5.8 (L) 02/03/2021 0137   ALBUMIN 2.2 (L) 02/03/2021 0137   AST 18 02/03/2021 0137   ALT 12 02/03/2021 0137   ALKPHOS 74 02/03/2021 0137   BILITOT 0.5 02/03/2021 0137   GFRNONAA 15 (L) 02/07/2021 0135    GFR Estimated Creatinine Clearance: 24 mL/min (A) (by C-G formula based on SCr of 3.84 mg/dL (H)). No results for input(s): LIPASE, AMYLASE in the last 72 hours. No results for input(s): CKTOTAL, CKMB, CKMBINDEX, TROPONINI in the last 72 hours. Invalid input(s): POCBNP No results for input(s): DDIMER in the last 72 hours. No results for input(s): HGBA1C in the last 72 hours. No results for input(s): CHOL, HDL, LDLCALC, TRIG, CHOLHDL, LDLDIRECT in the last 72 hours. No results for input(s): TSH, T4TOTAL, T3FREE, THYROIDAB in the last 72  hours.  Invalid input(s): FREET3 No results for input(s): VITAMINB12, FOLATE, FERRITIN, TIBC, IRON, RETICCTPCT in the last 72 hours. Coags: No results for input(s): INR in the last 72 hours.  Invalid input(s): PT Microbiology: Recent Results (from the past 240 hour(s))  Resp Panel by RT-PCR (Flu A&B, Covid) Nasopharyngeal Swab     Status: Abnormal   Collection Time: 02/03/21  1:29 AM   Specimen: Nasopharyngeal Swab; Nasopharyngeal(NP) swabs in vial transport medium  Result Value Ref Range Status   SARS Coronavirus 2 by RT PCR NEGATIVE NEGATIVE Final    Comment: (NOTE) SARS-CoV-2 target nucleic acids are NOT DETECTED.  The SARS-CoV-2 RNA is generally detectable in upper respiratory specimens during the acute phase of infection. The lowest concentration of SARS-CoV-2 viral copies this assay can detect is 138 copies/mL. A negative result does not preclude SARS-Cov-2 infection and should not be used as the sole  basis for treatment or other patient management decisions. A negative result may occur with  improper specimen collection/handling, submission of specimen other than nasopharyngeal swab, presence of viral mutation(s) within the areas targeted by this assay, and inadequate number of viral copies(<138 copies/mL). A negative result must be combined with clinical observations, patient history, and epidemiological information. The expected result is Negative.  Fact Sheet for Patients:  EntrepreneurPulse.com.au  Fact Sheet for Healthcare Providers:  IncredibleEmployment.be  This test is no t yet approved or cleared by the [place] FDA and  has been authorized for detection and/or diagnosis of SARS-CoV-2 by FDA under an Emergency Use Authorization (EUA). This EUA will remain  in effect (meaning this test can be used) for the duration of the COVID-19 declaration under Section 564(b)(1) of the Act, 21 U.S.C.section 360bbb-3(b)(1), unless the  authorization is terminated  or revoked sooner.       Influenza A by PCR POSITIVE (A) NEGATIVE Final   Influenza B by PCR NEGATIVE NEGATIVE Final    Comment: (NOTE) The Xpert Xpress SARS-CoV-2/FLU/RSV plus assay is intended as an aid in the diagnosis of influenza from Nasopharyngeal swab specimens and should not be used as a sole basis for treatment. Nasal washings and aspirates are unacceptable for Xpert Xpress SARS-CoV-2/FLU/RSV testing.  Fact Sheet for Patients: EntrepreneurPulse.com.au  Fact Sheet for Healthcare Providers: IncredibleEmployment.be  This test is not yet approved or cleared by the [place] FDA and has been authorized for detection and/or diagnosis of SARS-CoV-2 by FDA under an Emergency Use Authorization (EUA). This EUA will remain in effect (meaning this test can be used) for the duration of the COVID-19 declaration under Section 564(b)(1) of the Act, 21 U.S.C. section 360bbb-3(b)(1), unless the authorization is terminated or revoked.  Performed at [facility], [place] [address] [place], [place] [address]     FURTHER DISCHARGE INSTRUCTIONS:  Get Medicines reviewed and adjusted: Please take all your medications with you for your next visit with your Primary MD  Laboratory/radiological data: Please request your Primary MD to go over all hospital tests and procedure/radiological results at the follow up, please ask your Primary MD to get all Hospital records sent to his/her office.  In some cases, they will be blood work, cultures and biopsy results pending at the time of your discharge. Please request that your primary care M.D. goes through all the records of your hospital data and follows up on these results.  Also Note the following: If you experience worsening of your admission symptoms, develop shortness of breath, life threatening emergency, suicidal or homicidal thoughts you must seek medical attention  immediately by calling 911 or calling your MD immediately  if symptoms less severe.  You must read complete instructions/literature along with all the possible adverse reactions/side effects for all the Medicines you take and that have been prescribed to you. Take any new Medicines after you have completely understood and accpet all the possible adverse reactions/side effects.   Do not drive when taking Pain medications or sleeping medications (Benzodaizepines)  Do not take more than prescribed Pain, Sleep and Anxiety Medications. It is not advisable to combine anxiety,sleep and pain medications without talking with your primary care practitioner  Special Instructions: If you have smoked or chewed Tobacco  in the last 2 yrs please stop smoking, stop any regular Alcohol  and or any Recreational drug use.  Wear Seat belts while driving.  Please note: You were cared for by a hospitalist during your hospital stay.  Once you are discharged, your primary care physician will handle any further medical issues. Please note that NO REFILLS for any discharge medications will be authorized once you are discharged, as it is imperative that you return to your primary care physician (or establish a relationship with a primary care physician if you do not have one) for your post hospital discharge needs so that they can reassess your need for medications and monitor your lab values.  Total Time spent coordinating discharge including counseling, education and face to face time equals 35 minutes.  Signed: Korvin Valentine 02/07/2021 11:11 AM

## 2021-02-07 NOTE — TOC Transition Note (Signed)
Transition of Care [facility]) - CM/SW Discharge Note   Patient Details  Name: Bianca Tapia MRN: [id number] Date of Birth: [birth year]  [facility] CM/SW Contact:  Cyndi Bender, RN Phone Number: 02/07/2021, 12:06 PM   Clinical Narrative:    Patient stable for discharge. Spoke to patient regarding discharge needs. Patient stated she would rather do outpatient PT then [facility] PT.  Referral to [place] rehab   Final next level of care: Home/Self Care Barriers to Discharge: Barriers Resolved   Patient Goals and CMS Choice    Return Home Discharge Placement                   Home with outpatient therapy     Discharge Plan and Services   Discharge Planning Services: CM Consult            DME Arranged: Gilford Rile rolling DME Agency: [employer] Date DME Agency Contacted: 02/05/21 Time DME Agency Contacted: [phone] Representative spoke with at DME Agency: [place] Arranged: Patient Refused [place] Agency: NA        Social Determinants of Health ([place]) Interventions     Readmission Risk Interventions No flowsheet data found.

## 2021-02-07 NOTE — Progress Notes (Signed)
Pt given discharge instructions, prescriptions, and care notes. Pt verbalized understanding AEB no further questions or concerns at this time. IV was discontinued, no redness, pain, or swelling noted at this time. Telemetry discontinued and Centralized Telemetry was notified. Pt left the floor via wheelchair with staff in stable condition. 

## 2021-02-07 NOTE — Progress Notes (Signed)
PT Cancellation Note  Patient Details Name: Bianca Tapia MRN: [id number] DOB: 04-18-1986   Cancelled Treatment:    Reason Eval/Treat Not Completed: Patient declined, states she is going home today and does not want to do therapy prior to leaving. Emphasized the number of steps she needs to climb to get to bedroom and she insists she will be able to and "put me down for not doing therapy today."  RN made aware.    Arby Barrette, PT Acute Rehabilitation Services  Pager [phone] Office [phone]    Rexanne Mano 02/07/2021, 8:37 AM

## 2021-02-20 ENCOUNTER — Ambulatory Visit: Payer: Medicaid Other | Attending: Internal Medicine | Admitting: Physical Therapy

## 2021-05-07 ENCOUNTER — Encounter (HOSPITAL_COMMUNITY): Payer: Self-pay

## 2021-05-07 ENCOUNTER — Other Ambulatory Visit: Payer: Self-pay

## 2021-05-07 ENCOUNTER — Emergency Department (HOSPITAL_COMMUNITY)
Admission: EM | Admit: 2021-05-07 | Discharge: 2021-05-08 | Disposition: A | Discharge status: Home / Self Care | Payer: Medicaid Other | Attending: Emergency Medicine | Admitting: Emergency Medicine

## 2021-05-07 DIAGNOSIS — N189 Chronic kidney disease, unspecified: Secondary | ICD-10-CM | POA: Diagnosis not present

## 2021-05-07 DIAGNOSIS — R519 Headache, unspecified: Secondary | ICD-10-CM | POA: Insufficient documentation

## 2021-05-07 DIAGNOSIS — E1122 Type 2 diabetes mellitus with diabetic chronic kidney disease: Secondary | ICD-10-CM | POA: Insufficient documentation

## 2021-05-07 DIAGNOSIS — Z5321 Procedure and treatment not carried out due to patient leaving prior to being seen by health care provider: Secondary | ICD-10-CM | POA: Insufficient documentation

## 2021-05-07 DIAGNOSIS — R112 Nausea with vomiting, unspecified: Secondary | ICD-10-CM | POA: Insufficient documentation

## 2021-05-07 HISTORY — DX: Essential (primary) hypertension: I10

## 2021-05-07 LAB — COMPREHENSIVE METABOLIC PANEL
ALT: 15 U/L (ref 0–44)
AST: 21 U/L (ref 15–41)
Albumin: 1.7 g/dL — ABNORMAL LOW (ref 3.5–5.0)
Alkaline Phosphatase: 85 U/L (ref 38–126)
Anion gap: 9 (ref 5–15)
BUN: 28 mg/dL — ABNORMAL HIGH (ref 6–20)
CO2: 24 mmol/L (ref 22–32)
Calcium: 8.4 mg/dL — ABNORMAL LOW (ref 8.9–10.3)
Chloride: 104 mmol/L (ref 98–111)
Creatinine, Ser: 4.77 mg/dL — ABNORMAL HIGH (ref 0.44–1.00)
GFR, Estimated: 12 mL/min — ABNORMAL LOW (ref >60)
Glucose, Bld: 204 mg/dL — ABNORMAL HIGH (ref 70–99)
Potassium: 3.1 mmol/L — ABNORMAL LOW (ref 3.5–5.1)
Sodium: 137 mmol/L (ref 135–145)
Total Bilirubin: 0.5 mg/dL (ref 0.3–1.2)
Total Protein: 5.3 g/dL — ABNORMAL LOW (ref 6.5–8.1)

## 2021-05-07 LAB — CBC
HCT: 38.1 % (ref 36.0–46.0)
Hemoglobin: 13.1 g/dL (ref 12.0–15.0)
MCH: 29.4 pg (ref 26.0–34.0)
MCHC: 34.4 g/dL (ref 30.0–36.0)
MCV: 85.6 fL (ref 80.0–100.0)
Platelets: 277 10*3/uL (ref 150–400)
RBC: 4.45 MIL/uL (ref 3.87–5.11)
RDW: 11.7 % (ref 11.5–15.5)
WBC: 5.7 10*3/uL (ref 4.0–10.5)
nRBC: 0 % (ref 0.0–0.2)

## 2021-05-07 LAB — I-STAT BETA HCG BLOOD, ED (MC, WL, AP ONLY): I-stat hCG, quantitative: <5 m[IU]/mL (ref <5)

## 2021-05-07 LAB — LIPASE, BLOOD: Lipase: 24 U/L (ref 11–51)

## 2021-05-07 MED ORDER — ONDANSETRON 4 MG PO TBDP
4.0000 mg | ORAL_TABLET | Freq: Once | ORAL | Status: DC
  Filled 2021-05-07: qty 1

## 2021-05-07 NOTE — ED Provider Triage Note (Signed)
Emergency Medicine Provider Triage Evaluation Note  Bianca Tapia , a 36 y.o. female  was evaluated in triage.  Pt complains of nausea and nonbloody emesis x2 days.  She is a diabetic and has a history of CKD.  Unsure sick contacts.  Patient has associated headache, decreased p.o. intake, generalized abdominal pain.  Has not tried any medications for symptoms.  Denies fever, chills, dysuria, hematuria.    Review of Systems  Positive: As per HPI above. Negative: Fever, chills, dysuria, hematuria  Physical Exam  BP (!) 158/109 (BP Location: Right Arm)    Pulse (!) 106    Temp 98.2 F (36.8 C) (Oral)    Resp 18    Ht 5\' 2"  (1.575 m)    Wt 104.3 kg    SpO2 100%    BMI 42.07 kg/m  Gen:   Awake, no distress   Resp:  Normal effort  MSK:   Moves extremities without difficulty  Other:  No abdominal tenderness to palpation.  Medical Decision Making  Medically screening exam initiated at 8:36 PM.  Appropriate orders placed.  Bianca Tapia was informed that the remainder of the evaluation will be completed by another provider, this initial triage assessment does not replace that evaluation, and the importance of remaining in the ED until their evaluation is complete.   Bianca Tapia A, PA-C 05/07/21 2040

## 2021-05-07 NOTE — ED Triage Notes (Addendum)
Pt arrived POV c/o N/V x2 days. Pt tried over the counter meds and still cannot keep anything down. Pt is a diabetic and has a hx of kidney disease. 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 Pt endorses a headache as well.

## 2021-05-08 NOTE — ED Notes (Signed)
Patient states she is leaving. 

## 2021-07-27 ENCOUNTER — Other Ambulatory Visit (HOSPITAL_COMMUNITY): Payer: Self-pay

## 2023-02-25 DIAGNOSIS — I639 Cerebral infarction, unspecified: Secondary | ICD-10-CM

## 2023-02-25 HISTORY — DX: Cerebral infarction, unspecified: I63.9

## 2023-05-28 ENCOUNTER — Other Ambulatory Visit: Payer: Self-pay

## 2023-05-28 ENCOUNTER — Emergency Department (HOSPITAL_COMMUNITY): Payer: Medicare Other

## 2023-05-28 ENCOUNTER — Encounter (HOSPITAL_COMMUNITY): Payer: Self-pay

## 2023-05-28 ENCOUNTER — Emergency Department (HOSPITAL_COMMUNITY)
Admission: EM | Admit: 2023-05-28 | Discharge: 2023-05-28 | Disposition: A | Discharge status: Home / Self Care | Payer: Medicare Other | Attending: Emergency Medicine | Admitting: Emergency Medicine

## 2023-05-28 DIAGNOSIS — Z6841 Body Mass Index (BMI) 40.0 and over, adult: Secondary | ICD-10-CM | POA: Insufficient documentation

## 2023-05-28 DIAGNOSIS — R109 Unspecified abdominal pain: Secondary | ICD-10-CM | POA: Diagnosis present

## 2023-05-28 DIAGNOSIS — Z20822 Contact with and (suspected) exposure to covid-19: Secondary | ICD-10-CM | POA: Insufficient documentation

## 2023-05-28 DIAGNOSIS — H547 Unspecified visual loss: Secondary | ICD-10-CM | POA: Insufficient documentation

## 2023-05-28 DIAGNOSIS — R531 Weakness: Secondary | ICD-10-CM | POA: Insufficient documentation

## 2023-05-28 DIAGNOSIS — N186 End stage renal disease: Secondary | ICD-10-CM | POA: Insufficient documentation

## 2023-05-28 DIAGNOSIS — R1084 Generalized abdominal pain: Secondary | ICD-10-CM

## 2023-05-28 DIAGNOSIS — E1122 Type 2 diabetes mellitus with diabetic chronic kidney disease: Secondary | ICD-10-CM | POA: Diagnosis not present

## 2023-05-28 DIAGNOSIS — W19XXXA Unspecified fall, initial encounter: Secondary | ICD-10-CM | POA: Diagnosis not present

## 2023-05-28 DIAGNOSIS — N271 Small kidney, bilateral: Secondary | ICD-10-CM | POA: Insufficient documentation

## 2023-05-28 DIAGNOSIS — Z992 Dependence on renal dialysis: Secondary | ICD-10-CM | POA: Diagnosis not present

## 2023-05-28 DIAGNOSIS — Z794 Long term (current) use of insulin: Secondary | ICD-10-CM | POA: Diagnosis not present

## 2023-05-28 DIAGNOSIS — I7091 Generalized atherosclerosis: Secondary | ICD-10-CM | POA: Insufficient documentation

## 2023-05-28 DIAGNOSIS — N200 Calculus of kidney: Secondary | ICD-10-CM | POA: Insufficient documentation

## 2023-05-28 DIAGNOSIS — I120 Hypertensive chronic kidney disease with stage 5 chronic kidney disease or end stage renal disease: Secondary | ICD-10-CM | POA: Diagnosis not present

## 2023-05-28 DIAGNOSIS — E669 Obesity, unspecified: Secondary | ICD-10-CM | POA: Insufficient documentation

## 2023-05-28 DIAGNOSIS — I517 Cardiomegaly: Secondary | ICD-10-CM | POA: Diagnosis not present

## 2023-05-28 LAB — RESP PANEL BY RT-PCR (RSV, FLU A&B, COVID)  RVPGX2
Influenza A by PCR: NEGATIVE
Influenza B by PCR: NEGATIVE
Resp Syncytial Virus by PCR: NEGATIVE
SARS Coronavirus 2 by RT PCR: NEGATIVE

## 2023-05-28 LAB — CBC
HCT: 24.1 % — ABNORMAL LOW (ref 36.0–46.0)
Hemoglobin: 8.2 g/dL — ABNORMAL LOW (ref 12.0–15.0)
MCH: 32.4 pg (ref 26.0–34.0)
MCHC: 34 g/dL (ref 30.0–36.0)
MCV: 95.3 fL (ref 80.0–100.0)
Platelets: 217 10*3/uL (ref 150–400)
RBC: 2.53 MIL/uL — ABNORMAL LOW (ref 3.87–5.11)
RDW: 15.1 % (ref 11.5–15.5)
WBC: 5 10*3/uL (ref 4.0–10.5)
nRBC: 2.6 % — ABNORMAL HIGH (ref 0.0–0.2)

## 2023-05-28 LAB — COMPREHENSIVE METABOLIC PANEL
ALT: 24 U/L (ref 0–44)
AST: 21 U/L (ref 15–41)
Albumin: 3 g/dL — ABNORMAL LOW (ref 3.5–5.0)
Alkaline Phosphatase: 53 U/L (ref 38–126)
Anion gap: 13 (ref 5–15)
BUN: 29 mg/dL — ABNORMAL HIGH (ref 6–20)
CO2: 28 mmol/L (ref 22–32)
Calcium: 9.6 mg/dL (ref 8.9–10.3)
Chloride: 96 mmol/L — ABNORMAL LOW (ref 98–111)
Creatinine, Ser: 13.88 mg/dL — ABNORMAL HIGH (ref 0.44–1.00)
GFR, Estimated: 3 mL/min — ABNORMAL LOW (ref >60)
Glucose, Bld: 146 mg/dL — ABNORMAL HIGH (ref 70–99)
Potassium: 3 mmol/L — ABNORMAL LOW (ref 3.5–5.1)
Sodium: 137 mmol/L (ref 135–145)
Total Bilirubin: 0.3 mg/dL (ref 0.0–1.2)
Total Protein: 6 g/dL — ABNORMAL LOW (ref 6.5–8.1)

## 2023-05-28 LAB — LIPASE, BLOOD: Lipase: 30 U/L (ref 11–51)

## 2023-05-28 LAB — HCG, SERUM, QUALITATIVE: Preg, Serum: NEGATIVE

## 2023-05-28 LAB — CBG MONITORING, ED: Glucose-Capillary: 113 mg/dL — ABNORMAL HIGH (ref 70–99)

## 2023-05-28 NOTE — ED Notes (Addendum)
 Pt non ambulatory, currently working with PT, provider notified.

## 2023-05-28 NOTE — ED Provider Notes (Signed)
  Physical Exam  BP (!) 158/84 (BP Location: Right Arm)   Pulse 79   Temp 98.5 F (36.9 C) (Oral)   Resp 14   Ht 5' 2 (1.575 m)   Wt 104.3 kg   SpO2 100%   BMI 42.07 kg/m   Physical Exam Constitutional:      General: She is not in acute distress.    Appearance: She is well-developed.  Abdominal:     General: There is no distension.     Tenderness: There is no abdominal tenderness.  Neurological:     Mental Status: She is alert.      ED Course / MDM   Clinical Course as of 05/28/23 0907  Wed May 28, 2023  0715 Received sign out from Dr. Bari, hx of PD, had a fall and now with some abdominal pain. Hx anemia. Pending PD fluid draw [WS]  0857 I evaluated the patient, no abdominal tenderness on exam. discussed with nephrology Dr. Susannah, can have HD nurses draw fluid but will be significant delay.  He believes chance of infection is low.  Given no leukocytosis, no fever in the emergency department, no peritoneal signs, I agree that the chance of bacterial peritonitis seems low.  Dr. Susannah gust the patient and she does not really want to wait to have the fluid drawn and overall feels well.  He thinks this is reasonable.  I discussed with the patient I also think this is reasonable given overall low concern for infection.  He recommends the patient can also follow-up with her PD nurse to have fluid culture drawn.  Discussed with patient that she should immediately return should she develop any worsening or severe pain, fevers, vomiting, change in dialysis fluid, or any other new symptoms. [WS]    Clinical Course User Index [WS] Francesca Elsie CROME, MD   Medical Decision Making Amount and/or Complexity of Data Reviewed Labs: ordered. Radiology: ordered.          Francesca Elsie CROME, MD 05/28/23 [phone]

## 2023-05-28 NOTE — ED Triage Notes (Signed)
 Pt BIB [facility] from home for generalized weakness and abd pain/constipation since Saturday. Pt also reports that she fell on Saturday r/t weakness. H/x multiple strokes, peritoneal dialysis, diabetes.

## 2023-05-28 NOTE — Discharge Instructions (Signed)
 We evaluated you for your abdominal pain.  Your CT scan was negative for any dangerous findings.  We discussed draining the fluid from your dialysis catheter, but you preferred to go home and follow-up with your kidney doctor.  We think the chance of infection is very low, but if you develop any new or worsening symptoms such as change in your dialysis fluid, worsening or severe pain, fevers or chills, vomiting, lightheadedness or dizziness, fainting, please immediately return to the emergency department.

## 2023-05-28 NOTE — ED Provider Notes (Signed)
 [place] [facility] DEPARTMENT AT [facility] Provider Note   CSN: [id number] Arrival date & time: 05/28/23  0222     History  Chief Complaint  Patient presents with   Fall   Weakness   Abdominal Pain    Bianca Tapia is a 38 y.o. female.  HPI     This is a 38 year old female with history of end-stage renal disease on peritoneal dialysis, diabetes, blindness who presents with generalized weakness and abdominal pain.  Patient reports onset of symptoms last week.  She saw her nephrologist on Wednesday.  She was told that she was anemic but did not need a transfusion.  She states generally she has not felt well and she has been fatigued.  She reports abdominal pain.  No fevers.  Denies nausea or vomiting.  Has not had any sick contacts.  Denies chest pain or shortness of breath.  Patient does report falling on Saturday.  She describes crumpling over onto the ground.  She did not hit her head or lose consciousness.  She has had abdominal pain since that time.  Home Medications Prior to Admission medications   Medication Sig Start Date End Date Taking? Authorizing Provider  amLODipine  (NORVASC ) 10 MG tablet Take 1 tablet (10 mg total) by mouth daily. 02/07/21   Ghimire, Donalda HERO, MD  atorvastatin  (LIPITOR) 40 MG tablet Take 40 mg by mouth at bedtime. 01/25/21   [provider]  bisoprolol  (ZEBETA ) 10 MG tablet Take 0.5 tablets (5 mg total) by mouth daily. 02/02/20   Cleotilde Garnette HERO, MD  Continuous Blood Gluc Sensor (DEXCOM G6 SENSOR) MISC Apply topically as directed. 01/17/21   [provider]  Continuous Blood Gluc Sensor (FREESTYLE LIBRE 2 SENSOR) MISC 1 each by Does not apply route every 14 (fourteen) days.    [provider]  Continuous Blood Gluc Transmit (DEXCOM G6 TRANSMITTER) MISC  01/17/21   [provider]  hydrochlorothiazide  (HYDRODIURIL ) 25 MG tablet Take 1 tablet (25 mg total) by mouth daily. Patient not taking: Reported on  11/01/2019 11/22/17   Khatri, Hina, PA-C  insulin  glargine (LANTUS  SOLOSTAR) 100 UNIT/ML Solostar Pen Inject 10 Units into the skin at bedtime. 11/01/19   Kassie Mallick, MD  insulin  lispro (HUMALOG  KWIKPEN) 100 UNIT/ML KwikPen Inject 0.1 mLs (10 Units total) into the skin 3 (three) times daily with meals. And pen needles 4/day 11/01/19   Kassie Mallick, MD  Insulin  Pen Needle (BD PEN NEEDLE NANO U/F) 32G X 4 MM MISC 1 each by Does not apply route 3 (three) times daily.    [provider]  Insulin  Pen Needle (BD PEN NEEDLE NANO U/F) 32G X 4 MM MISC 3 times a day 02/01/19   [provider]  Insulin  Syringe-Needle U-100 (B-D INSULIN  SYRINGE 1CC/25G) 25G X 5/8 1 ML MISC 1 each by Does not apply route daily.    [provider]  irbesartan -hydrochlorothiazide  (AVALIDE) 300-12.5 MG tablet Take 1 tablet by mouth daily. 02/02/20   Cleotilde Garnette HERO, MD  ketorolac  (ACULAR ) 0.4 % SOLN Place 1 drop into both eyes in the morning and at bedtime. 10/05/20   [provider]  LANTUS  SOLOSTAR 100 UNIT/ML Solostar Pen Inject 12 Units into the skin daily. 02/07/21   Ghimire, Donalda HERO, MD  ondansetron  (ZOFRAN ) 8 MG tablet Take 8 mg by mouth every 8 (eight) hours as needed for nausea or vomiting.    [provider]  OXcarbazepine (TRILEPTAL) 150 MG tablet Take 150 mg by  mouth 2 (two) times daily. Patient not taking: Reported on 11/01/2019    [provider]  Polyethylene Glycol 3350 (MIRALAX PO) Take by mouth.    [provider]  Ranibizumab (LUCENTIS) 0.3 MG/0.05ML SOLN by Intravitreal route.    [provider]  sertraline (ZOLOFT) 25 MG tablet Take 25 mg by mouth daily. Patient not taking: Reported on 11/01/2019    [provider]  TRULICITY 0.75 MG/0.5ML SOPN Inject 0.75 mg into the skin every Friday. 01/29/21   [provider]  Vitamin D, Ergocalciferol, (DRISDOL) 1.25 MG (50000 UNIT) CAPS capsule Take 50,000 Units by mouth every  Tuesday. 01/17/21   [provider]      Allergies    Other, Other, Penicillins, Penicillins, and Zofran  [ondansetron ]    Review of Systems   Review of Systems  Constitutional:  Positive for fatigue. Negative for fever.  Respiratory:  Negative for shortness of breath.   Cardiovascular:  Negative for chest pain.  Gastrointestinal:  Positive for abdominal pain.  All other systems reviewed and are negative.   Physical Exam Updated Vital Signs BP (!) 156/73 (BP Location: Left Arm)   Pulse 85   Temp 98.4 F (36.9 C) (Oral)   Resp 18   Ht 1.575 m (5' 2)   Wt 104.3 kg   SpO2 99%   BMI 42.07 kg/m  Physical Exam Vitals and nursing note reviewed.  Constitutional:      Appearance: She is well-developed. She is obese.  HENT:     Head: Normocephalic and atraumatic.  Eyes:     Pupils: Pupils are equal, round, and reactive to light.  Cardiovascular:     Rate and Rhythm: Normal rate and regular rhythm.     Heart sounds: Normal heart sounds.  Pulmonary:     Effort: Pulmonary effort is normal. No respiratory distress.     Breath sounds: No wheezing.  Abdominal:     General: Bowel sounds are normal.     Palpations: Abdomen is soft.     Tenderness: There is no abdominal tenderness.     Comments: PD catheter right mid abdomen without adjacent erythema, no significant tenderness  Musculoskeletal:     Cervical back: Neck supple.  Skin:    General: Skin is warm and dry.  Neurological:     Mental Status: She is alert and oriented to person, place, and time.  Psychiatric:        Mood and Affect: Mood normal.     ED Results / Procedures / Treatments   Labs (all labs ordered are listed, but only abnormal results are displayed) Labs Reviewed  COMPREHENSIVE METABOLIC PANEL - Abnormal; Notable for the following components:      Result Value   Potassium 3.0 (*)    Chloride 96 (*)    Glucose, Bld 146 (*)    BUN 29 (*)    Creatinine, Ser 13.88 (*)    Total Protein 6.0 (*)     Albumin 3.0 (*)    GFR, Estimated 3 (*)    All other components within normal limits  CBC - Abnormal; Notable for the following components:   RBC 2.53 (*)    Hemoglobin 8.2 (*)    HCT 24.1 (*)    nRBC 2.6 (*)    All other components within normal limits  CBG MONITORING, ED - Abnormal; Notable for the following components:   Glucose-Capillary 113 (*)    All other components within normal limits  BODY FLUID CULTURE LELON SETTLE  STAIN  GRAM STAIN  RESP PANEL BY RT-PCR (RSV, FLU A&B, COVID)  RVPGX2  LIPASE, BLOOD  HCG, SERUM, QUALITATIVE  URINALYSIS, ROUTINE W REFLEX MICROSCOPIC  SYNOVIAL CELL COUNT + DIFF, W/ CRYSTALS  GLUCOSE, BODY FLUID OTHER              EKG None  Radiology CT ABDOMEN PELVIS WO CONTRAST Result Date: 05/28/2023 CLINICAL DATA:  Abdominal pain, weakness, and constipation. Fell 4 days ago. End-stage renal failure on peritoneal dialysis. History of diabetes. EXAM: CT ABDOMEN AND PELVIS WITHOUT CONTRAST TECHNIQUE: Multidetector CT imaging of the abdomen and pelvis was performed following the standard protocol without IV contrast. RADIATION DOSE REDUCTION: This exam was performed according to the departmental dose-optimization program which includes automated exposure control, adjustment of the mA and/or kV according to patient size and/or use of iterative reconstruction technique. COMPARISON:  Multiple CTs in the past 2 years. The 2 most recent are CT angio chest, abdomen and pelvis 03/13/2023, CT abdomen and pelvis without contrast 03/09/2023. FINDINGS: Lower chest: There are bilateral trace pleural effusions, new in the interval. Linear atelectasis or scarring in the left lower lobe. Lung bases are clear of infiltrates. The heart is slightly enlarged but unchanged. The cardiac blood pool is less dense than the myocardium compatible with anemia. Hepatobiliary: No focal liver abnormality is seen without contrast. Status post cholecystectomy. No biliary dilatation. Pancreas: No focal  abnormality is seen without contrast. Spleen: Unremarkable without contrast. Adrenals/Urinary Tract: Chronic perinephric fat stranding. There are small kidneys consistent with dialysis dependent renal failure. There is no adrenal mass no contour deforming abnormality of the unenhanced kidneys. There are occasional punctate nonobstructive caliceal stones in both kidneys and renovascular calcifications at the renal hila. No urinary stone or obstruction is evident. There is thickening of the bladder which could be due to underdistention or cystitis. Stomach/Bowel: Unremarkable gastric wall. Normal caliber unopacified small bowel. Again noted surgical absence appendix. No dilatation or wall thickening of the colon. There is mild fecal stasis. Scattered diverticulosis without evidence of diverticulitis. Vascular/Lymphatic: Extensive atherosclerosis, heaviest in the visceral and pelvic large arteries, extending into the femoral arteries. There is no AAA. No adenopathy is seen without contrast. Reproductive: Uterus and bilateral adnexa are unremarkable. Other: Peritoneal dialysis catheter tubing is again noted coiled in the posterior deep pelvis. No tube entry complication is seen. Small umbilical fat hernia. Mild-to-moderate increased free fluid is seen in the abdomen and pelvis. No free air is evident. Musculoskeletal: Old left hip nailing. Scarring and dystrophic calcifications are noted extending lateral to the proximal left femur, and into the subcutaneous plane. There previously was an air and fluid collection in this location which has resolved. Currently no acute or significant osseous abnormality is seen. IMPRESSION: 1. Mild-to-moderate increased free fluid in the abdomen and pelvis. 2. Trace bilateral pleural effusions. 3. Cardiomegaly with evidence of anemia. 4. Small kidneys with heavy renovascular calcification and occasional nephrolithiasis. 5. Bladder thickening which could be due to underdistention or  cystitis. 6. Constipation and diverticulosis. 7. Extensive atherosclerosis. 8. Old left hip nailing with scarring and dystrophic calcifications extending lateral to the proximal left femur, and into the subcutaneous plane. There previously was an air and fluid collection in this location which has resolved. 9. Small umbilical fat hernia. Electronically Signed   By: Francis Quam M.D.   On: 05/28/2023 04:39    Procedures Procedures    Medications Ordered in ED Medications - No data to display  ED Course/ Medical Decision Making/ A&P  Medical Decision Making Amount and/or Complexity of Data Reviewed Labs: ordered. Radiology: ordered.   This patient presents to the ED for concern of abdominal pain, generalized weakness, this involves an extensive number of treatment options, and is a complaint that carries with it a high risk of complications and morbidity.  I considered the following differential and admission for this acute, potentially life threatening condition.  The differential diagnosis includes SBP, SBO, constipation, gastritis, metabolic derangements, anemia  MDM:    This is a 38 year old female who presents with generalized abdominal pain.  Also reports generalized weakness and fatigue.  PD patient every night.  Has not noted any changes in her PD fluid.  No fevers.  Mostly complaining of abdominal discomfort.  Labs obtained and reviewed.  Labs notable for a potassium of 3.0.  Creatinine of 13.88.  No white count.  Hemoglobin is 8.2.  Denies any ongoing or active bleeding.  CT scan shows free fluid which is likely related to her PD.  She has some effusions.  Multiple incidental findings.  Will attempt to get PD fluids to rule out SBP.  (Labs, imaging, consults)  Labs: I Ordered, and personally interpreted labs.  The pertinent results include: CBC CMP, lipase, hCG  Imaging Studies ordered: I ordered imaging studies including CT imaging I  independently visualized and interpreted imaging. I agree with the radiologist interpretation  Additional history obtained from chart review.  External records from outside source obtained and reviewed including prior evaluations  Cardiac Monitoring: The patient was not maintained on a cardiac monitor.  If on the cardiac monitor, I personally viewed and interpreted the cardiac monitored which showed an underlying rhythm of: N/A  Reevaluation: After the interventions noted above, I reevaluated the patient and found that they have :stayed the same  Social Determinants of Health:  lives with family  Disposition: Pending  Co morbidities that complicate the patient evaluation  Past Medical History:  Diagnosis Date   CKD (chronic kidney disease), stage III (HCC)    Diabetes (HCC)    Diabetes mellitus with retinopathy (HCC)    Diabetes mellitus without complication (HCC)    Type 1   Hypertension    Partial blindness    Renal disorder    Stroke (HCC) 02/25/2023     Medicines No orders of the defined types were placed in this encounter.   I have reviewed the patients home medicines and have made adjustments as needed  Problem List / ED Course: Problem List Items Addressed This Visit   None               Final Clinical Impression(s) / ED Diagnoses Final diagnoses:  None    Rx / DC Orders ED Discharge Orders     None         Bari Charmaine FALCON, MD 05/28/23 [phone]
